# Patient Record
Sex: Female | Born: 1970 | Race: White | Hispanic: No | Smoking: Never smoker
Health system: Southern US, Community
[De-identification: ages and names within clinical notes are randomized; demographics above are authoritative.]

## PROBLEM LIST (undated history)

## (undated) DIAGNOSIS — K429 Umbilical hernia without obstruction or gangrene: Secondary | ICD-10-CM

## (undated) DIAGNOSIS — J45909 Unspecified asthma, uncomplicated: Secondary | ICD-10-CM

## (undated) DIAGNOSIS — Z9889 Other specified postprocedural states: Secondary | ICD-10-CM

## (undated) DIAGNOSIS — R112 Nausea with vomiting, unspecified: Secondary | ICD-10-CM

## (undated) DIAGNOSIS — L309 Dermatitis, unspecified: Secondary | ICD-10-CM

## (undated) HISTORY — PX: MICRODISCECTOMY LUMBAR: SUR864

## (undated) HISTORY — DX: Unspecified asthma, uncomplicated: J45.909

## (undated) HISTORY — PX: ABDOMINOPLASTY: SUR9

## (undated) HISTORY — PX: EYE SURGERY: SHX253

## (undated) HISTORY — DX: Dermatitis, unspecified: L30.9

## (undated) HISTORY — DX: Umbilical hernia without obstruction or gangrene: K42.9

## (undated) HISTORY — PX: OTHER SURGICAL HISTORY: SHX169

---

## 2006-01-25 ENCOUNTER — Encounter: Admission: RE | Admit: 2006-01-25 | Discharge: 2006-01-25 | Payer: Self-pay | Admitting: Radiology

## 2006-02-10 ENCOUNTER — Encounter
Admission: RE | Admit: 2006-02-10 | Discharge: 2006-02-10 | Payer: Self-pay | Admitting: Physical Medicine and Rehabilitation

## 2006-04-21 ENCOUNTER — Ambulatory Visit (HOSPITAL_COMMUNITY): Admission: RE | Admit: 2006-04-21 | Discharge: 2006-04-22 | Payer: Self-pay | Admitting: Neurosurgery

## 2006-05-02 ENCOUNTER — Ambulatory Visit (HOSPITAL_COMMUNITY): Admission: RE | Admit: 2006-05-02 | Discharge: 2006-05-02 | Payer: Self-pay | Admitting: Obstetrics and Gynecology

## 2006-08-29 ENCOUNTER — Ambulatory Visit (HOSPITAL_COMMUNITY): Admission: RE | Admit: 2006-08-29 | Discharge: 2006-08-29 | Payer: Self-pay | Admitting: Obstetrics and Gynecology

## 2006-09-23 ENCOUNTER — Inpatient Hospital Stay (HOSPITAL_COMMUNITY): Admission: AD | Admit: 2006-09-23 | Discharge: 2006-09-27 | Payer: Self-pay | Admitting: Obstetrics and Gynecology

## 2008-02-06 ENCOUNTER — Ambulatory Visit (HOSPITAL_COMMUNITY): Admission: RE | Admit: 2008-02-06 | Discharge: 2008-02-06 | Payer: Self-pay | Admitting: Obstetrics and Gynecology

## 2008-03-08 ENCOUNTER — Ambulatory Visit (HOSPITAL_COMMUNITY): Admission: RE | Admit: 2008-03-08 | Discharge: 2008-03-08 | Payer: Self-pay | Admitting: Obstetrics and Gynecology

## 2008-03-22 ENCOUNTER — Ambulatory Visit (HOSPITAL_COMMUNITY): Admission: RE | Admit: 2008-03-22 | Discharge: 2008-03-22 | Payer: Self-pay | Admitting: Obstetrics and Gynecology

## 2008-06-21 HISTORY — PX: TUBAL LIGATION: SHX77

## 2008-08-12 ENCOUNTER — Inpatient Hospital Stay (HOSPITAL_COMMUNITY): Admission: RE | Admit: 2008-08-12 | Discharge: 2008-08-15 | Payer: Self-pay | Admitting: Obstetrics and Gynecology

## 2008-08-12 ENCOUNTER — Encounter (INDEPENDENT_AMBULATORY_CARE_PROVIDER_SITE_OTHER): Payer: Self-pay | Admitting: Obstetrics and Gynecology

## 2009-01-27 ENCOUNTER — Encounter: Admission: RE | Admit: 2009-01-27 | Discharge: 2009-01-27 | Payer: Self-pay | Admitting: Neurosurgery

## 2009-06-02 ENCOUNTER — Encounter: Admission: RE | Admit: 2009-06-02 | Discharge: 2009-06-02 | Payer: Self-pay | Admitting: Neurosurgery

## 2009-06-16 ENCOUNTER — Ambulatory Visit (HOSPITAL_COMMUNITY): Admission: RE | Admit: 2009-06-16 | Discharge: 2009-06-17 | Payer: Self-pay | Admitting: Neurosurgery

## 2009-06-23 ENCOUNTER — Encounter: Admission: RE | Admit: 2009-06-23 | Discharge: 2009-06-23 | Payer: Self-pay | Admitting: Neurosurgery

## 2009-08-19 ENCOUNTER — Ambulatory Visit (HOSPITAL_COMMUNITY): Admission: RE | Admit: 2009-08-19 | Discharge: 2009-08-19 | Payer: Self-pay | Admitting: Neurosurgery

## 2009-12-16 ENCOUNTER — Ambulatory Visit (HOSPITAL_COMMUNITY): Admission: RE | Admit: 2009-12-16 | Discharge: 2009-12-16 | Payer: Self-pay | Admitting: Neurosurgery

## 2010-02-10 ENCOUNTER — Encounter: Admission: RE | Admit: 2010-02-10 | Discharge: 2010-03-31 | Payer: Self-pay | Admitting: Neurology

## 2010-09-07 ENCOUNTER — Ambulatory Visit: Payer: PRIVATE HEALTH INSURANCE | Attending: Neurology

## 2010-09-07 DIAGNOSIS — M47812 Spondylosis without myelopathy or radiculopathy, cervical region: Secondary | ICD-10-CM | POA: Insufficient documentation

## 2010-09-07 DIAGNOSIS — M2569 Stiffness of other specified joint, not elsewhere classified: Secondary | ICD-10-CM | POA: Insufficient documentation

## 2010-09-07 DIAGNOSIS — M79609 Pain in unspecified limb: Secondary | ICD-10-CM | POA: Insufficient documentation

## 2010-09-07 DIAGNOSIS — R209 Unspecified disturbances of skin sensation: Secondary | ICD-10-CM | POA: Insufficient documentation

## 2010-09-07 DIAGNOSIS — IMO0001 Reserved for inherently not codable concepts without codable children: Secondary | ICD-10-CM | POA: Insufficient documentation

## 2010-09-21 LAB — CBC
HCT: 39.1 % (ref 36.0–46.0)
Hemoglobin: 13.4 g/dL (ref 12.0–15.0)
MCHC: 34.4 g/dL (ref 30.0–36.0)
MCV: 88.2 fL (ref 78.0–100.0)
Platelets: 209 10*3/uL (ref 150–400)
RBC: 4.43 MIL/uL (ref 3.87–5.11)
RDW: 12.7 % (ref 11.5–15.5)
WBC: 6 10*3/uL (ref 4.0–10.5)

## 2010-10-06 LAB — CBC
HCT: 31.3 % — ABNORMAL LOW (ref 36.0–46.0)
HCT: 35.7 % — ABNORMAL LOW (ref 36.0–46.0)
MCHC: 33.9 g/dL (ref 30.0–36.0)
MCHC: 34.2 g/dL (ref 30.0–36.0)
MCV: 88.2 fL (ref 78.0–100.0)
MCV: 88.3 fL (ref 78.0–100.0)
Platelets: 157 10*3/uL (ref 150–400)
RDW: 13.9 % (ref 11.5–15.5)

## 2010-11-03 NOTE — Discharge Summary (Signed)
[name], [name]              ACCOUNT NO.:  [id number]   MEDICAL RECORD NO.:  [id number]          PATIENT TYPE:  INP   LOCATION:  9135                          [facility]ACILITY[facility]  [facility]   PHYSICIAN:  Zelphia Cairo, MD    DATE OF BIRTH:  [birth year]   DATE OF ADMISSION:  08/12/2008  DATE OF DISCHARGE:  08/15/2008                               DISCHARGE SUMMARY   ADMITTING DIAGNOSES:  1. Intrauterine pregnancy at 37 weeks estimated gestational age.  2. Previous cesarean section, desires repeat.  3. Multiparity, desires permanent sterilization.   DISCHARGE DIAGNOSES:  1. Status post low transverse cesarean section.  2. Viable female infant.  3. Bilateral tubal ligation.   REASON FOR ADMISSION:  Please see written H&P.   HOSPITAL COURSE:  The patient is 40 year old gravida 2, para 1 and was  noted to [facility] at 39 weeks estimated gestational  age for scheduled cesarean section.  The patient had had a previous  cesarean delivery and desired repeat.  Due to multiparity, the patient  also requested permanent sterilization.  On the morning of admission,  the patient was taken to the operating room where spinal anesthesia was  administered without difficulty.  A low transverse incision was made  with delivery of a viable female infant weighing 8 pounds 3 ounces,  Apgars of 9 at 1 minute and 9 at 5 minutes.  Bilateral tubal ligation  was performed without difficulty.  The patient tolerated the procedure  well and was taken to the recovery room in stable condition.  On  postoperative day #1, the patient was without complaint.  Vital signs  were stable.  She was afebrile.  Abdomen is soft with good return of  bowel function.  Laboratory findings revealed hemoglobin of 10.7,  platelet count 157,000, WBC count of 10.8.  Blood type was known to be  O+.  On postoperative day #2, the patient was without complaint.  Vital  signs remained stable.  She was afebrile.  Fundus firm and  nontender.  Incision was clean, dry, and intact.  On postoperative day #3, the  patient was without complaint.  Vital signs remained stable.  She was  afebrile.  Fundus firm and nontender.  Incision was noted to have some  ecchymosis noted superior and inferior to the incisional site.  Incision  was noted with subcuticular closure.  She was ambulating well.  Discharge instructions were reviewed and the patient was later  discharged home.   CONDITION ON DISCHARGE:  Stable.   DIET:  Regular, as tolerated.   ACTIVITY:  No heavy lifting, no driving x2 weeks, no vaginal entry.   FOLLOW UP:  The patient is to follow up in the office in 1-2 weeks for  an incision check.  She is to call for temperature greater than 100  degrees, persistent nausea, vomiting, heavy vaginal bleeding, and/or  redness or drainage from the incisional site.   DISCHARGE MEDICATIONS:  1. Tylox #30 one p.o. for every 6 hours.  2. Motrin 600 mg every 6 hours.  3. Prenatal vitamins 1 p.o. daily.  4. Colace 1 p.o.  daily.      Julio Sicks, N.P.      Zelphia Cairo, MD  Electronically Signed    CC/MEDQ  D:  08/15/2008  T:  08/15/2008  Job:  [id number]

## 2010-11-03 NOTE — Op Note (Signed)
NAMEBLONDINE, [name]              ACCOUNT NO.:  [id number]   MEDICAL RECORD NO.:  [id number]          PATIENT TYPE:  INP   LOCATION:  9135                          [facility]  [facility]   PHYSICIAN:  Michelle L. Grewal, M.D.DATE OF BIRTH:  02-24-1971   DATE OF PROCEDURE:  08/12/2008  DATE OF DISCHARGE:                               OPERATIVE REPORT   PREOPERATIVE DIAGNOSES:  1. Intrauterine pregnancy at 39 plus weeks.  2. Previous cesarean section.  3. Desires permanent sterilization.   POSTOPERATIVE DIAGNOSES:  1. Intrauterine pregnancy at 39 plus weeks.  2. Previous cesarean section.  3. Desires permanent sterilization.   PROCEDURE:  Repeat low transverse cesarean section and modified [place]  bilateral tubal ligation.   SURGEON:  Michelle L. Grewal, MD   ANESTHESIA:  Spinal.   FINDINGS:  This is a female infant, weighing 8 pounds 3 ounces, Apgars 9  at 1-minute and 9 at 5 minutes.   SPECIMENS:  Bilateral fallopian tube segments sent to Pathology.   ESTIMATED BLOOD LOSS:  Less than 500.   COMPLICATIONS:  None.   PROCEDURE:  The patient was taken to the operating room and her spinal  was placed.  She was prepped and draped in the usual sterile fashion.  Foley catheter was inserted and draining clear urine.  After she was  draped, a low transverse incision was made in the skin and carried down  to the fascia.  Fascia scored in the midline and extended laterally.  The rectus muscles were separated in the midline.  The peritoneum was  entered bluntly.  The peritoneal incision was then stretched.  The  bladder blade was inserted, the lower uterine segment was identified,  the bladder flap was created sharply and then digitally.  The bladder  blade was then readjusted.  A low transverse incision was made in the  uterus.  Amniotic fluid was noted to be clear.  The baby was in cephalic  presentation, was delivered easily with a vacuum extractor.  The baby  was a female infant,  Apgars 9 at 1-minute and 9 at 5 minutes.  The cord  was clamped and cut.  The baby was handed to the awaiting pediatricians  and was taken to the newborn nursery.  The placenta was manually  removed, noted to be normal intact with a three-vessel cord.  Of note,  antibiotics and Pitocin were given.  The uterus was exteriorized and it  was cleared of all clots and debris.  The uterine incision was closed in  2 layers using 0 chromic in a running locked stitch.  Hemostasis was  excellent.  At this point, we performed a tubal ligation, identified  each fimbriated end, and grasped each midportion of the tube with a  Babcock clamp.  A 3-cm knuckle was tied off using plain gut suture x2  and then excessive knuckle was cut with Metzenbaum scissors.  After the  tubal ligation was performed, the uterus was returned to the abdomen.  Irrigation was performed.  All surgical pedicles were inspected and  noted to be hemostatic.  The peritoneum and rectus muscles were  reapproximated using 0 Vicryl in a running stitch after reapproximation  of the rectus muscles.  The fascia was closed using 0 Vicryl in a  running stitch continuous.  After irrigation of the subcutaneous layer,  the subcutaneous was closed with subcuticular using 3-0 Vicryl and the  skin was closed with Dermabond skin adhesive.  All sponge, lap, and  instrument counts were correct x2.  The patient went to recovery room in  stable condition.      Michelle L. Vincente Poli, M.D.  Electronically Signed     MLG/MEDQ  D:  08/12/2008  T:  08/12/2008  Job:  [id number]

## 2010-11-06 ENCOUNTER — Other Ambulatory Visit (HOSPITAL_COMMUNITY): Payer: Self-pay | Admitting: Neurosurgery

## 2010-11-06 ENCOUNTER — Ambulatory Visit (HOSPITAL_COMMUNITY)
Admission: RE | Admit: 2010-11-06 | Discharge: 2010-11-06 | Disposition: A | Discharge status: Home / Self Care | Payer: PRIVATE HEALTH INSURANCE | Source: Ambulatory Visit | Attending: Neurosurgery | Admitting: Neurosurgery

## 2010-11-06 DIAGNOSIS — M545 Low back pain, unspecified: Secondary | ICD-10-CM | POA: Insufficient documentation

## 2010-11-06 DIAGNOSIS — M5137 Other intervertebral disc degeneration, lumbosacral region: Secondary | ICD-10-CM

## 2010-11-06 DIAGNOSIS — IMO0002 Reserved for concepts with insufficient information to code with codable children: Secondary | ICD-10-CM

## 2010-11-06 DIAGNOSIS — M51379 Other intervertebral disc degeneration, lumbosacral region without mention of lumbar back pain or lower extremity pain: Secondary | ICD-10-CM | POA: Insufficient documentation

## 2010-11-06 DIAGNOSIS — M519 Unspecified thoracic, thoracolumbar and lumbosacral intervertebral disc disorder: Secondary | ICD-10-CM | POA: Insufficient documentation

## 2010-11-06 DIAGNOSIS — M5126 Other intervertebral disc displacement, lumbar region: Secondary | ICD-10-CM | POA: Insufficient documentation

## 2010-11-06 NOTE — Op Note (Signed)
[name], [name]              ACCOUNT NO.:  [id number]   MEDICAL RECORD NO.:  [id number]          PATIENT TYPE:  AMB   LOCATION:  SDS                          FACILITY:  [facility]   PHYSICIAN:  Hewitt Shorts, M.D.DATE OF BIRTH:  05-27-1971   DATE OF PROCEDURE:  04/21/2006  DATE OF DISCHARGE:                                 OPERATIVE REPORT   PREOPERATIVE DIAGNOSES:  Right L5-S1 lumbar disk herniation, lumbar  degenerative disk disease, lumbar spondylosis and lumbar radiculopathy.   POSTOPERATIVE DIAGNOSES:  Right L5-S1 lumbar disk herniation, lumbar  degenerative disk disease, lumbar spondylosis and lumbar radiculopathy.   PROCEDURE:  Right L5-S1 lumbar laminotomy and microdiskectomy.   SURGEON:  Hewitt Shorts, M.D.   ASSISTANT:  Hilda Lias, M.D.   ANESTHESIA:  General endotracheal.   INDICATIONS:  The patient is a 40 year old woman, who presented with a right  L5-S1 lumbar disk herniation, resulting in lumbar radiculopathy.  She is [redacted]  weeks pregnant.  Despite extensive conservative treatment over the past 2  months, the pain has become unbearable and she is brought to surgery for  microdiskectomy.   DESCRIPTION OF PROCEDURE:  The patient was brought to the operating room,  placed under general endotracheal anesthesia.  The patient was turned to the  prone position, with care taken to insure that her iliac crest and shoulders  rested against the rolls and that her abdomen hung freely between the rolls.  The lumbar region was then prepared with Betadine Soap and Solution and  draped in a sterile fashion.  The midline was infiltrated with local  anesthetic with epinephrine.  The L5 and S1 levels were identified by bony  landmarks, and then a midline incision was made and carried down through the  subcutaneous tissue.  Bipolar cautery and electrocautery were used to  maintain hemostasis.  Dissection was carried down to the lumbar fascia,  which was incised on the  right side of the midline, and the paraspinous  muscles were dissected from the spinous process and lamina in a  subperiosteal fashion.  The sacrum was identified, and from there, we  identified the L5-S1 interlaminar space.  A self-retaining retractor was  placed and laminotomies were performed using the X-Max drill and Kerrison  punches.  The microscope was draped and brought into the field to provide  additional magnification, illumination and visualization, and the  decompression was performed using microdissection and microsurgical  technique.  The laminotomy was extended both rostrally and caudally, and  then the ligamentum flavum was carefully removed.  We identified the thecal  sac and exiting right S1 nerve root, and these structures were gently  retracted medially.  The epidural veins were identified and coagulated and  divided, and then we gently retracted the thecal sac and nerve root  medially, exposing the disk herniation.  There was a disk herniation at the  level of the annulus, and then a significant fragment that extended caudally  behind the body of S1 and directly compressing the right S1 nerve root.  We  incised the annulus and mobilized the free fragment, and the nerve  root was  decompressed.  We then entered into the disk space and proceeded with a  thorough diskectomy, removing all loose fragments of disk material.  There  was moderate spondylytic overgrowth on the posterior aspects at L5 and S1,  and this was carefully removed as well.  In the end, good decompression of  the thecal sac and nerve root was achieved.  The epidural space and disk  space were carefully examined and all loose fragments of disk material had  been removed, and then we established hemostasis with the use of bipolar  cautery, and once hemostasis was established and the diskectomy completed,  we instilled 2 cc of fentanyl and 80 mg of Depo-Medrol into the epidural  space, and then proceeded  with closure.  The deep fascia was closed with  interrupted, undyed 1 Vicryl suture.  Scarpa's fascia was closed with  interrupted, undyed 1 Vicryl suture.  The subcutaneous and subcuticular  layers were closed interrupted, inverted 2-0 and 3-0 undyed Vicryl sutures,  and the skin edges were approximated with Dermabond and the wound was  dressed with Adaptic and sterile gauze.  The procedure was tolerated well.  Following the procedure, she was turned back to a supine position to be  reversed from the anesthetic, extubated and was transferred to the recovery  room for further care.      Hewitt Shorts, M.D.  Electronically Signed     RWN/MEDQ  D:  04/21/2006  T:  04/22/2006  Job:  [id number]   cc:   Hilda Lias, M.D.

## 2010-11-06 NOTE — Op Note (Signed)
[name], [name]              ACCOUNT NO.:  [id number]   MEDICAL RECORD NO.:  [id number]          PATIENT TYPE:  INP   LOCATION:  9130                          [facility]ILITY[facility]   PHYSICIAN:  Duke Salvia. Marcelle Overlie, M.D.DATE OF BIRTH:  05-23-1971   DATE OF PROCEDURE:  09/24/2006  DATE OF DISCHARGE:                               OPERATIVE REPORT   PREOPERATIVE DIAGNOSIS:  Term intrauterine pregnancy, scheduled cesarean  due to history of lower back surgery, range of motion with labor.   POSTOPERATIVE DIAGNOSIS:  Term intrauterine pregnancy, scheduled  cesarean due to history of lower back surgery, range of motion with  labor.   PROCEDURE:  Primary low transverse cesarean section.   SURGEON:  [place]   ANESTHESIA:  Spinal.   COMPLICATIONS:  None.   DRAINS:  Foley catheter.   BLOOD LOSS:  600.   PROCEDURE AND FINDINGS:  The patient was taken to the operating room.  After an adequate level of spinal anesthetic was obtained with the  patient in left tilt position, the abdomen prepped in the usual manner  for sterile abdominal procedures, Foley catheter positioned draining  clear urine.  Transverse Pfannenstiel incision made 2 fingerbreadths  above the symphysis, carried down to the fascia which was incised and  extended transversely.  Rectus muscle was divided in the midline.  Peritoneum entered superiorly without incident and extended in a  vertical fashion.  The vesicouterine serosa was then incised, and the  bladder was bluntly and sharply dissected off of the lower uterine  segment, and bladder blade was positioned.  Transverse incision made in  the lower segment.  Clear fluid was noted.  This was dissected with  blunt dissection transversely, straight OP presentation.  The infant was  delivered easily, Apgars 9 and 9, female.  The infant was suctioned, cord  clamped, and passed to pediatric team for further care.  Placenta  delivered spontaneously intact.  Uterus exteriorized,  cavity wiped clean  with a laparotomy pack, closure obtained with the first layer of 0  chromic in a locked fashion followed by an imbricating layer of 0  chromic.  This was hemostatic.  Tubes and ovaries were normal.  Prior to  closure, sponge, needle, and instrument counts were reported as correct  x2.  Peritoneum closed with a running 2-0 Vicryl, the same on the rectus  muscles.  A 0 PDS was then used from laterally to midline on either side  to close the fascia.  Subcutaneous tissue was  hemostatic.  Clips and Steri-Strips used on the skin.  Sterile dressing  applied.  Clear urine noted at the end of the case.  She did receive  Ancef 1 g IV after the cord was clamped along with Pitocin IV and went  to recovery room in good condition.      Richard M. Marcelle Overlie, M.D.  Electronically Signed     RMH/MEDQ  D:  09/24/2006  T:  09/24/2006  Job:  [id number]

## 2010-11-06 NOTE — Discharge Summary (Signed)
NAMESUZI, HERNAN              ACCOUNT NO.:  [id number]   MEDICAL RECORD NO.:  [id number]          PATIENT TYPE:  INP   LOCATION:  9119                          [facility]   PHYSICIAN:  Guy Sandifer. Henderson Cloud, M.D. DATE OF BIRTH:  1971-01-18   DATE OF ADMISSION:  09/23/2006  DATE OF DISCHARGE:  09/27/2006                               DISCHARGE SUMMARY   ADMISSION DIAGNOSES:  1. Intrauterine pregnancy at term.  2. Spontaneous rupture of membranes with onset of labor.  3. History of lumbar disk surgery.   DISCHARGE DIAGNOSIS:  1. Status post low transverse cesarean section.  2. Viable female infant.   PROCEDURE:  Primary low transverse cesarean section.   REASON FOR ADMISSION:  Please see written H&P.   HOSPITAL COURSE:  The patient is a 40 year old primigravida that was  admitted to [facility] with spontaneous rupture of  membranes and onset of labor.  The patient had previously been scheduled  for a primary cesarean delivery in approximately 2 days secondary to  previous back surgery. Due to spontaneous onset of labor and rupture of  membranes decision was made to proceed with a primary low transverse  cesarean section.  The patient was then taken to the operating room  where spinal anesthesia was administered without difficulty.  A low  transverse incision was made with delivery of a viable female infant  weighing 7 pounds 12 ounces Apgars of 9 at 1 minute and 9 at 5 minutes.  The patient tolerated procedure well and taken to the recovery room in  stable condition.  On postoperative day #1 the patient was without  complaint.  Vital signs were stable.  Abdominal dressing was noted to be  clean, dry and intact.  On postoperative day #2 the patient was without  complaint.  She was afebrile.  Abdomen soft.  Fundus firm and nontender.  Abdominal dressing had been removed revealing an incision that is clean,  dry and intact.  Laboratory findings showed hemoglobin of  11.7. On  postoperative day #3 the patient was without complaint.  Vital signs  were stable.  She is afebrile.  Fundus firm and nontender.  Incision was  clean, dry and intact.  Staples were removed.  Discharge instructions  reviewed.  However, the patient was not discharged that day. On  postoperative day #4 the patient was without complaint.  Vital signs  were stable.  She is afebrile.  Abdomen soft.  Fundus firm and  nontender.  Incision was clean, dry and intact.  The patient was then  discharged home.   CONDITION ON DISCHARGE:  Stable.   DIET:  Regular as tolerated.   ACTIVITY:  No heavy lifting, no driving x2 weeks, no vaginal entry.   FOLLOW UP:  Patient is to follow up in the office in 1-2 weeks for  incision check.  She is to call for temperature greater than 100  degrees, persistent nausea, vomiting, heavy vaginal bleeding and/or  redness or drainage from incisional site.   DISCHARGE MEDICATIONS:  1. Percocet 5/325 #30 one p.o. every 4-6 hours p.r.n.  2. Motrin 600  mg every 6 hours.  3. Prenatal vitamins one p.o. daily.  4. Colace one p.o. daily p.r.n.      Julio Sicks, N.P.      Guy Sandifer. Henderson Cloud, M.D.  Electronically Signed    CC/MEDQ  D:  10/28/2006  T:  10/28/2006  Job:  [id number]

## 2011-01-13 ENCOUNTER — Other Ambulatory Visit: Payer: Self-pay | Admitting: Neurology

## 2011-01-13 DIAGNOSIS — M47812 Spondylosis without myelopathy or radiculopathy, cervical region: Secondary | ICD-10-CM

## 2011-01-13 DIAGNOSIS — R209 Unspecified disturbances of skin sensation: Secondary | ICD-10-CM

## 2011-01-21 ENCOUNTER — Ambulatory Visit
Admission: RE | Admit: 2011-01-21 | Discharge: 2011-01-21 | Disposition: A | Discharge status: Home / Self Care | Payer: PRIVATE HEALTH INSURANCE | Source: Ambulatory Visit | Attending: Neurology | Admitting: Neurology

## 2011-01-21 DIAGNOSIS — R209 Unspecified disturbances of skin sensation: Secondary | ICD-10-CM

## 2011-01-21 DIAGNOSIS — M47812 Spondylosis without myelopathy or radiculopathy, cervical region: Secondary | ICD-10-CM

## 2011-01-21 MED ORDER — GADOBENATE DIMEGLUMINE 529 MG/ML IV SOLN
12.0000 mL | Freq: Once | INTRAVENOUS | Status: AC | PRN
  Administered 2011-01-21: 12 mL via INTRAVENOUS

## 2011-12-30 ENCOUNTER — Other Ambulatory Visit: Payer: Self-pay | Admitting: Obstetrics and Gynecology

## 2012-01-03 ENCOUNTER — Telehealth (INDEPENDENT_AMBULATORY_CARE_PROVIDER_SITE_OTHER): Payer: Self-pay | Admitting: General Surgery

## 2012-01-03 NOTE — Telephone Encounter (Signed)
DR. Chilton Si FROM [facility] CALLED RE AN APPOINTMENT WITH DR. GERKIN FOR UMBILICAL HERNIA/GY

## 2012-01-05 ENCOUNTER — Ambulatory Visit (INDEPENDENT_AMBULATORY_CARE_PROVIDER_SITE_OTHER): Payer: PRIVATE HEALTH INSURANCE | Admitting: Surgery

## 2012-01-05 ENCOUNTER — Encounter (INDEPENDENT_AMBULATORY_CARE_PROVIDER_SITE_OTHER): Payer: Self-pay | Admitting: Surgery

## 2012-01-05 VITALS — BP 120/66 | HR 72 | Temp 97.4°F | Resp 14 | Ht 62.0 in | Wt 129.2 lb

## 2012-01-05 DIAGNOSIS — K429 Umbilical hernia without obstruction or gangrene: Secondary | ICD-10-CM

## 2012-01-05 DIAGNOSIS — M6208 Separation of muscle (nontraumatic), other site: Secondary | ICD-10-CM

## 2012-01-05 DIAGNOSIS — M6200 Separation of muscle (nontraumatic), unspecified site: Secondary | ICD-10-CM

## 2012-01-05 NOTE — Progress Notes (Signed)
General Surgery [facility]  Chief Complaint  Patient presents with  . Umbilical Hernia    new pt - umb hernia - referral from Dr. 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 Patient has had 2 children, ages 24 years old and 73 years old. She has developed a significant rectus diastasis.  She has seen a Engineer, petroleum in consultation and is anticipating an abdominoplasty. She was found to have an umbilical hernia which is reducible. She would like to undergo concurrent repair.  Patient has had 2 prior cesarean sections. She also had tubal ligation. She has had no other abdominal surgery.  Past Medical History  Diagnosis Date  . Umbilical hernia      Current Outpatient Prescriptions  Medication Sig Dispense Refill  . loratadine-pseudoephedrine (CLARITIN-D 12-HOUR) 5-120 MG per tablet Take 1 tablet by mouth 2 (two) times daily.         No Known Allergies   Family History  Problem Relation Age of Onset  . Cancer Mother     melanoma     History   Social History  . Marital Status: Married    Spouse Name: N/A    Number of Children: N/A  . Years of Education: N/A   Social History Main Topics  . Smoking status: Never Smoker   . Smokeless tobacco: None  . Alcohol Use: No  . Drug Use: No  . Sexually Active:    Other Topics Concern  . None   Social History Narrative  . None     REVIEW OF SYSTEMS - PERTINENT POSITIVES ONLY: No pain. No prior hernia repair. No signs or symptoms of intestinal obstruction.  EXAM: Filed Vitals:   01/05/12 1130  BP: 120/66  Pulse: 72  Temp: 97.4 F (36.3 C)  Resp: 14    HEENT: normocephalic; pupils equal and reactive; sclerae clear; dentition good; mucous membranes moist NECK:  symmetric on extension; no palpable anterior or posterior cervical lymphadenopathy; no supraclavicular masses; no tenderness; well-healed surgical incision  left anterior neck CHEST: clear to auscultation bilaterally without rales, rhonchi, or wheezes CARDIAC: regular rate and rhythm without significant murmur; peripheral pulses are full ABDOMEN: soft without distension; bowel sounds present; no mass; no hepatosplenomegaly; significant rectus diastases with redundant skin, small reducible umbilical hernia with fascial defect measuring approximately 1 cm in diameter. EXT:  non-tender without edema; no deformity NEURO: no gross focal deficits; no sign of tremor   LABORATORY RESULTS: See [facility] (CHL-Epic) for most recent results   RADIOLOGY RESULTS: See [facility] (CHL-Epic) for most recent results   IMPRESSION: #1 reducible umbilical hernia, asymptomatic #2 significant rectus diastasis secondary to pregnancy  PLAN: Patient is anticipating an abdominoplasty by her plastic surgeon in September 2013. She would like to undergo concurrent repair of her umbilical hernia. The patient and I discussed the risk and benefits of the combined procedure. We discussed the possibility of ischemia in the umbilical stalk. We discussed the possibility of recurrent hernia and options for repair. Patient understands and wishes to proceed. We will coordinate with plastic surgery.  The risks and benefits of the procedure have been discussed at length with the patient.  The patient understands the proposed procedure, potential alternative treatments, and the course of recovery to be expected.  All of the patient's questions have been answered at this time.  The patient wishes to proceed with surgery.  Velora Heckler, MD,  FACS General & Endocrine Surgery [facility]   Visit Diagnoses: 1. Umbilical hernia, reducible   2. Rectus diastasis     Primary Care Physician: Jeani Hawking, MD

## 2012-01-05 NOTE — Patient Instructions (Signed)
[facility]  [facility] REPAIR POST OP INSTRUCTIONS  Always review your discharge instruction sheet given to you by the facility where your surgery was performed.  1. A  prescription for pain medication may be given to you upon discharge.  Take your pain medication as prescribed.  If narcotic pain medicine is not needed, then you may take acetaminophen (Tylenol) or ibuprofen (Advil) as needed. 2. Take your usually prescribed medications unless otherwise directed. 3. If you need a refill on your pain medication, please contact your pharmacy.  They will contact our office to request authorization. Prescriptions will not be filled after 5 pm daily or on weekends. 4. You should follow a light diet the first 24 hours after arrival home, such as soup and crackers or toast.  Be sure to include plenty of fluids daily.  Resume your normal diet the day after surgery. 5. Most patients will experience some swelling and bruising around the surgical site.  Ice packs and reclining will help.  Swelling and bruising can take several days to resolve.  6. It is common to experience some constipation if taking pain medication after surgery.  Increasing fluid intake and taking a stool softener (such as Colace) will usually help or prevent this problem from occurring.  A mild laxative (Milk of Magnesia or Miralax) should be taken according to package directions if there are no bowel movements after 48 hours. 7. Unless discharge instructions indicate otherwise, you may remove your bandages 24-48 hours after surgery, and you may shower at that time.  You may have steri-strips (small skin tapes) in place directly over the incision.  These strips should be left on the skin for 7-10 days.  If your surgeon used skin glue on the incision, you may shower in 24 hours.  The glue will flake off over the next 2-3 weeks.  Any sutures or staples will be removed at the office during your follow-up visit. 8. ACTIVITIES:  You  may resume regular (light) daily activities beginning the next day-such as daily self-care, walking, climbing stairs-gradually increasing activities as tolerated.  You may have sexual intercourse when it is comfortable.  Refrain from any heavy lifting or straining until approved by your doctor.  You may drive when you are no longer taking prescription pain medication, you can comfortably wear a seatbelt, and you can safely maneuver your car and apply brakes. 9. You should see your doctor in the office for a follow-up appointment approximately 2-3 weeks after your surgery.  Make sure that you call for this appointment within a day or two after you arrive home to insure a convenient appointment time.  WHEN TO CALL YOUR DOCTOR: 1. Fever greater than 101.0 2. Inability to urinate 3. Persistent nausea and/or vomiting 4. Extreme swelling or bruising 5. Continued bleeding from incision 6. Increased pain, redness, or drainage from the incision  The clinic staff is available to answer your questions during regular business hours.  Please don't hesitate to call and ask to speak to one of the nurses for clinical concerns.  If you have a medical emergency, go to the nearest emergency room or call 911.  A surgeon from [facility] is always on call for the hospital.   [facility] [address], [address], [place],   [address]  [phone] ? [phone] ? FAX [phone] [email or web address]   

## 2012-03-06 DIAGNOSIS — K429 Umbilical hernia without obstruction or gangrene: Secondary | ICD-10-CM

## 2012-03-10 ENCOUNTER — Telehealth (INDEPENDENT_AMBULATORY_CARE_PROVIDER_SITE_OTHER): Payer: Self-pay

## 2012-03-10 NOTE — Telephone Encounter (Signed)
Pt home doing well. Po appt made. 

## 2012-03-22 ENCOUNTER — Ambulatory Visit (INDEPENDENT_AMBULATORY_CARE_PROVIDER_SITE_OTHER): Payer: PRIVATE HEALTH INSURANCE | Admitting: Surgery

## 2012-03-22 ENCOUNTER — Encounter (INDEPENDENT_AMBULATORY_CARE_PROVIDER_SITE_OTHER): Payer: Self-pay | Admitting: Surgery

## 2012-03-22 VITALS — BP 98/48 | HR 72 | Temp 97.1°F | Resp 16 | Ht 62.0 in | Wt 128.6 lb

## 2012-03-22 DIAGNOSIS — K429 Umbilical hernia without obstruction or gangrene: Secondary | ICD-10-CM

## 2012-03-22 NOTE — Progress Notes (Signed)
General Surgery [facility]  Visit Diagnoses: 1. Umbilical hernia, reducible     HISTORY: Patient returns for first postoperative visit having undergone umbilical hernia repair by primary technique concurrent with abdominoplasty. Patient has done well following her procedure except for the development of a contact dermatitis along the waistline.  EXAM: Surgical incisions are healing nicely. Contact dermatitis does exist along the waistline. It seems to be resolving. The umbilicus appears to be well vascularized. There is no sign of ischemia. With Valsalva there is no sign of recurrence.  IMPRESSION: Status post umbilical hernia repair concurrent with abdominoplasty, no evidence of recurrence  PLAN: Patient will return to activity as directed by her plastic surgeon. I've recommended that she apply topical moisturizing creams to her incisions. She will return for follow-up as needed.  Velora Heckler, MD, FACS General & Endocrine Surgery [facility]

## 2012-03-22 NOTE — Patient Instructions (Signed)
  COCOA BUTTER & VITAMIN E CREAM  (Palmer's or other brand)  Apply cocoa butter/vitamin E cream to your incision 2 - 3 times daily.  Massage cream into incision for one minute with each application.  Use sunscreen (50 SPF or higher) for first 6 months after surgery if area is exposed to sun.  You may substitute Mederma or other scar reducing creams as desired.   

## 2012-07-27 ENCOUNTER — Other Ambulatory Visit: Payer: Self-pay | Admitting: Obstetrics and Gynecology

## 2012-07-27 DIAGNOSIS — Z1231 Encounter for screening mammogram for malignant neoplasm of breast: Secondary | ICD-10-CM

## 2012-07-31 ENCOUNTER — Ambulatory Visit
Admission: RE | Admit: 2012-07-31 | Discharge: 2012-07-31 | Disposition: A | Discharge status: Home / Self Care | Payer: PRIVATE HEALTH INSURANCE | Source: Ambulatory Visit | Attending: Obstetrics and Gynecology | Admitting: Obstetrics and Gynecology

## 2012-07-31 DIAGNOSIS — Z1231 Encounter for screening mammogram for malignant neoplasm of breast: Secondary | ICD-10-CM

## 2013-01-16 ENCOUNTER — Other Ambulatory Visit: Payer: Self-pay | Admitting: Obstetrics and Gynecology

## 2013-07-20 ENCOUNTER — Other Ambulatory Visit: Payer: Self-pay

## 2013-07-20 DIAGNOSIS — Z1231 Encounter for screening mammogram for malignant neoplasm of breast: Secondary | ICD-10-CM

## 2013-08-14 ENCOUNTER — Ambulatory Visit
Admission: RE | Admit: 2013-08-14 | Discharge: 2013-08-14 | Disposition: A | Discharge status: Home / Self Care | Payer: PRIVATE HEALTH INSURANCE | Source: Ambulatory Visit

## 2013-08-14 DIAGNOSIS — Z1231 Encounter for screening mammogram for malignant neoplasm of breast: Secondary | ICD-10-CM

## 2014-01-18 ENCOUNTER — Other Ambulatory Visit: Payer: Self-pay | Admitting: Obstetrics and Gynecology

## 2014-01-21 LAB — CYTOLOGY - PAP

## 2014-07-17 ENCOUNTER — Other Ambulatory Visit: Payer: Self-pay | Admitting: Obstetrics and Gynecology

## 2014-07-17 DIAGNOSIS — Z0000 Encounter for general adult medical examination without abnormal findings: Secondary | ICD-10-CM

## 2014-07-24 ENCOUNTER — Ambulatory Visit
Admission: RE | Admit: 2014-07-24 | Discharge: 2014-07-24 | Disposition: A | Discharge status: Home / Self Care | Payer: PRIVATE HEALTH INSURANCE | Source: Ambulatory Visit | Attending: Obstetrics and Gynecology | Admitting: Obstetrics and Gynecology

## 2014-07-24 DIAGNOSIS — Z0000 Encounter for general adult medical examination without abnormal findings: Secondary | ICD-10-CM

## 2014-12-17 ENCOUNTER — Other Ambulatory Visit: Payer: Self-pay | Admitting: Neurosurgery

## 2014-12-17 DIAGNOSIS — M545 Low back pain: Secondary | ICD-10-CM

## 2014-12-18 ENCOUNTER — Ambulatory Visit
Admission: RE | Admit: 2014-12-18 | Discharge: 2014-12-18 | Disposition: A | Discharge status: Home / Self Care | Payer: PRIVATE HEALTH INSURANCE | Source: Ambulatory Visit | Attending: Neurosurgery | Admitting: Neurosurgery

## 2014-12-18 ENCOUNTER — Other Ambulatory Visit: Payer: PRIVATE HEALTH INSURANCE

## 2014-12-18 DIAGNOSIS — M545 Low back pain: Secondary | ICD-10-CM

## 2014-12-18 MED ORDER — GADOBENATE DIMEGLUMINE 529 MG/ML IV SOLN
12.0000 mL | Freq: Once | INTRAVENOUS | Status: AC | PRN
  Administered 2014-12-18: 12 mL via INTRAVENOUS

## 2014-12-25 ENCOUNTER — Other Ambulatory Visit: Payer: Self-pay | Admitting: Neurosurgery

## 2014-12-25 ENCOUNTER — Ambulatory Visit
Admission: RE | Admit: 2014-12-25 | Discharge: 2014-12-25 | Disposition: A | Discharge status: Home / Self Care | Payer: PRIVATE HEALTH INSURANCE | Source: Ambulatory Visit | Attending: Neurosurgery | Admitting: Neurosurgery

## 2014-12-25 DIAGNOSIS — M549 Dorsalgia, unspecified: Secondary | ICD-10-CM

## 2016-08-27 IMAGING — MR MR LUMBAR SPINE WO/W CM
5 of 7 series · 30 of 48 positions shown · IV contrast (multihance)
Comparison: MRI lumbar spine 01/25/2006. Lumbar radiographs
11/06/2010.

CLINICAL DATA: Low back and RIGHT leg pain for 1-2 weeks.
Subjective RIGHT leg weakness. Prior microdiscectomy 3119.

EXAM:
MRI LUMBAR SPINE WITHOUT AND WITH CONTRAST
TECHNIQUE: Multiplanar and multiecho pulse sequences of the lumbar spine were
obtained without and with intravenous contrast.
CONTRAST:  12mL MULTIHANCE GADOBENATE DIMEGLUMINE 529 MG/ML IV SOLN

[Series 2: T1 · sagittal · 4.0mm · 0.55mm/px · 4 of 14 slices shown (1 of 2)]
[im 1/14]
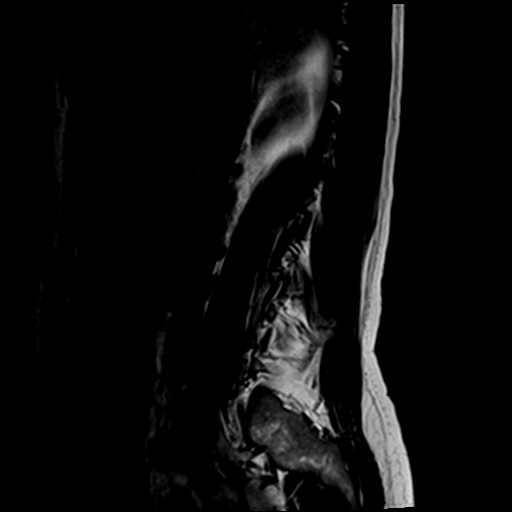
[im 5/14]
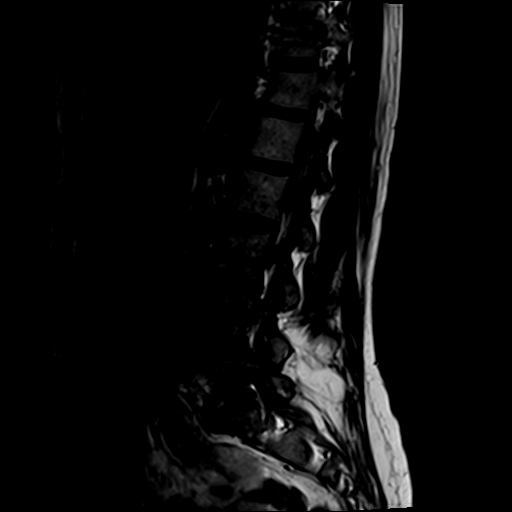
[im 9/14]
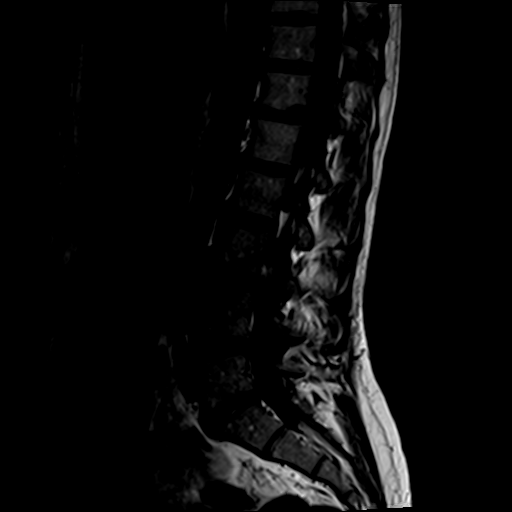
[im 14/14]
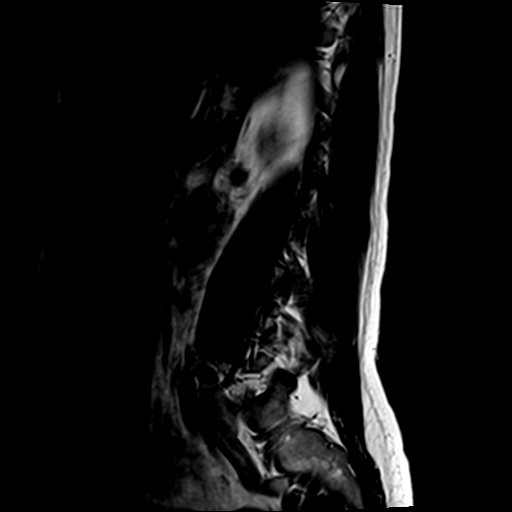

[Series 3: STIR · sagittal · 4.0mm · 0.55mm/px · 3 of 14 slices shown]
[im 1/14]
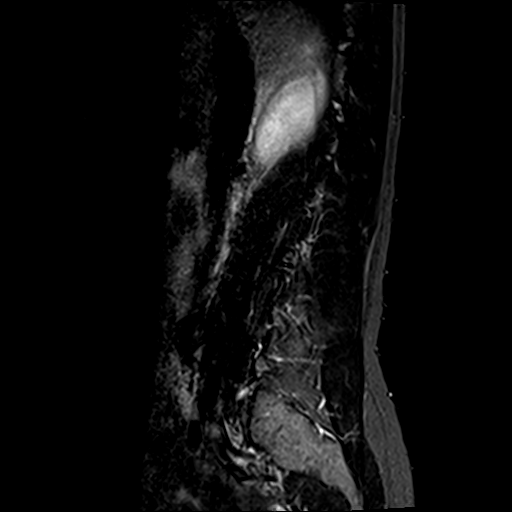
[im 5/14]
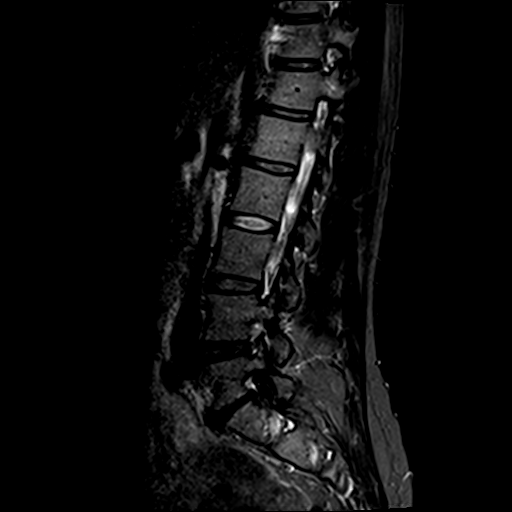
[im 9/14]
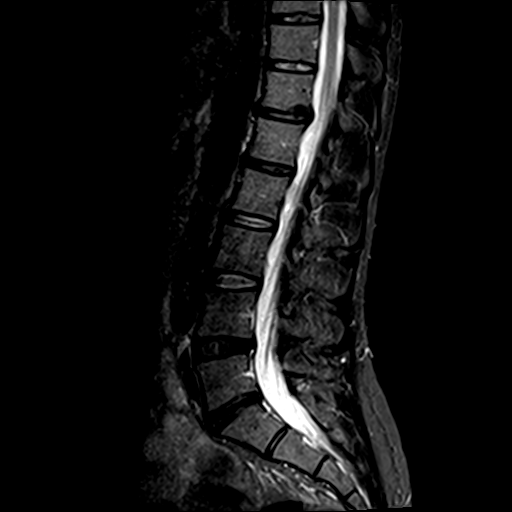

[Series 4: T2 · axial · 4.0mm · 0.74mm/px · z∈[-71,+99]mm · 8 of 36 slices shown (1 of 2)]
[im 1/36]
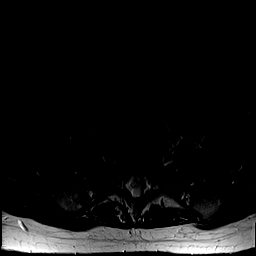
[im 4/36]
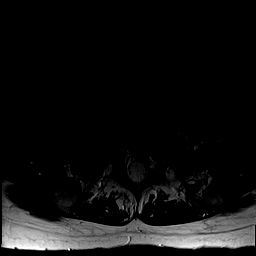
[im 12/36]
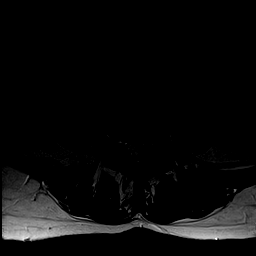
[im 16/36]
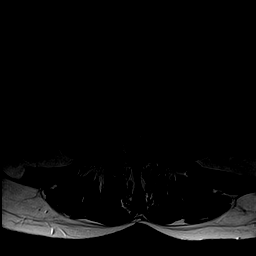
[im 20/36]
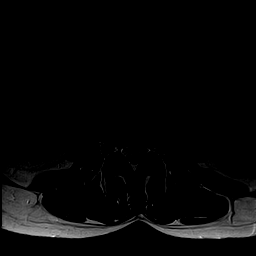
[im 24/36]
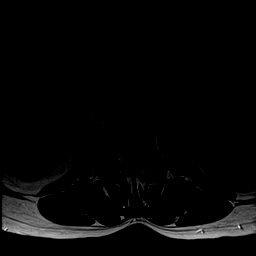
[im 32/36]
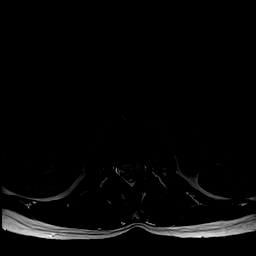
[im 36/36]
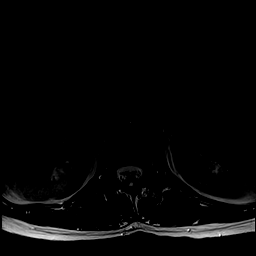

[Series 5: T1 · axial · 4.0mm · 0.74mm/px · z∈[-71,+125]mm · 11 of 39 slices shown (2 of 2)]
[im 1/39]
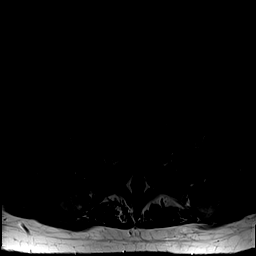
[im 4/39]
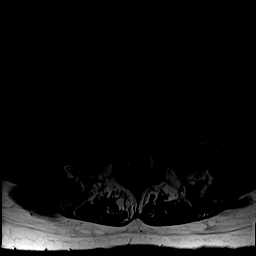
[im 8/39]
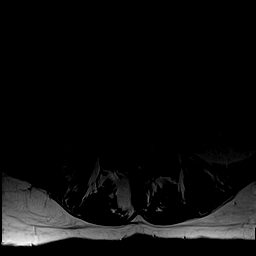
[im 12/39]
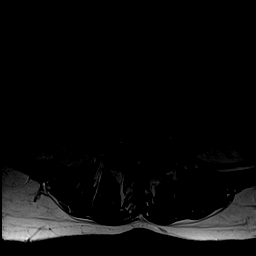
[im 16/39]
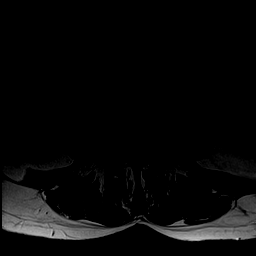
[im 20/39]
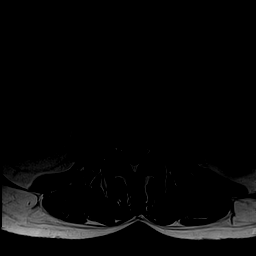
[im 23/39]
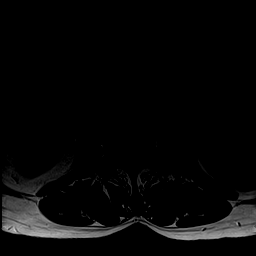
[im 27/39]
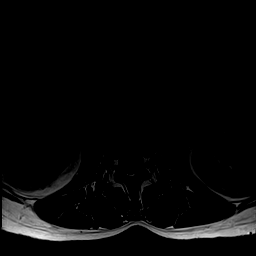
[im 31/39]
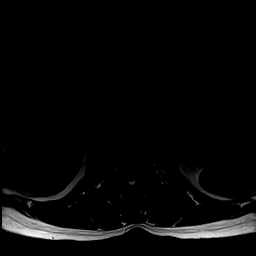
[im 35/39]
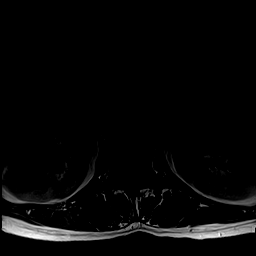
[im 39/39]
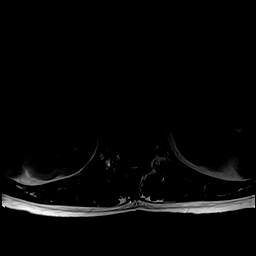

[Series 6: T2 · sagittal · 4.0mm · 0.44mm/px · 4 of 14 slices shown (2 of 2)]
[im 1/14]
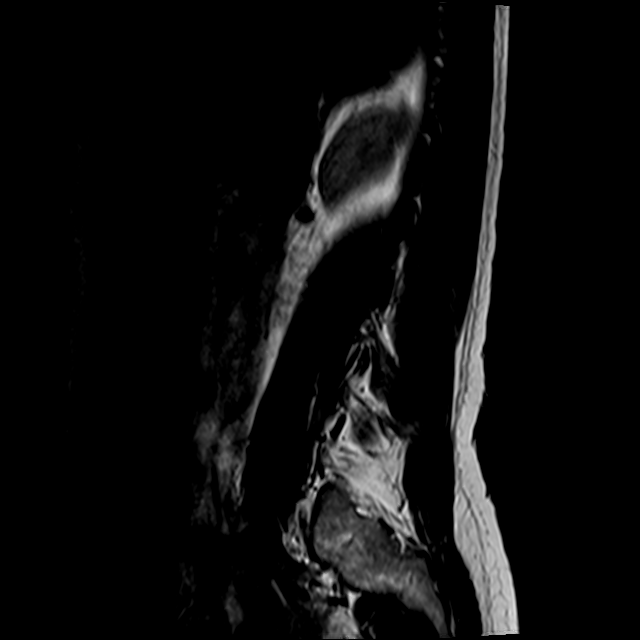
[im 5/14]
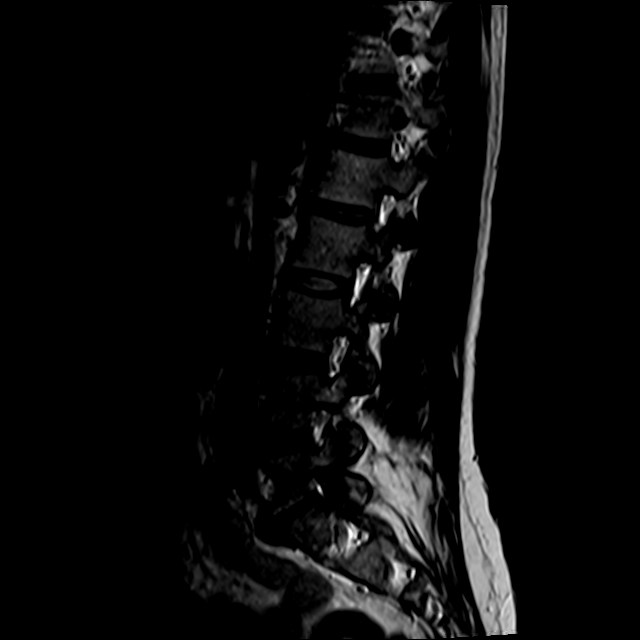
[im 9/14]
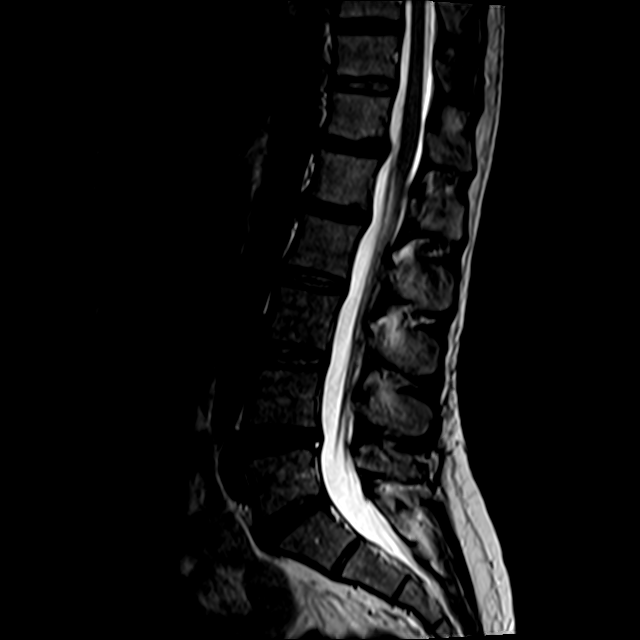
[im 14/14]
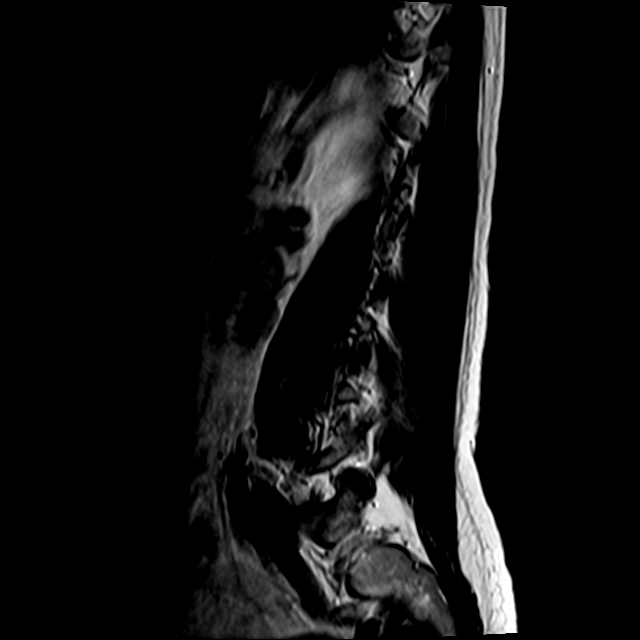

[30 of 48 positions shown; findings below may reference images not displayed]

FINDINGS: Segmentation: Normal.

Alignment:  Normal.

Vertebrae: No worrisome osseous lesion.No abnormal postcontrast
enhancement of the vertebral bodies.

Conus medullaris: Normal in size, signal, and location.

Paraspinal tissues: No evidence for hydronephrosis or paravertebral
mass.

Disc levels:

T12-L1: Marginal Schmorl's node projects superiorly along with a
shallow leftward protrusion. No impingement.

L1-L2: Shallow central protrusion. Minor endplate changes. No
impingement.

L2-L3:  Normal.

L3-L4:  Normal.

L4-L5: Small annular rent/subligamentous protrusion in the midline.
This is a chronic finding, unchanged from 3119 which is slightly
vascularized. Mild facet arthropathy. No impingement.

L5-S1: Status post RIGHT hemilaminotomy. Asymmetric loss of
interspace height on the RIGHT reflects prior discectomy. A small
annular rent is present but there is no recurrent protrusion of disc
material.

The RIGHT L5-S1 foramen is narrowed. This relates to loss of
right-sided interspace height, slight annular bulging, and RIGHT
greater than LEFT facet arthropathy. Symptomatic RIGHT L5 nerve root
impingement may be present. See for instance image 4 series 2, image
5 series 6, or image 31 series 4.
IMPRESSION: No recurrent protrusion of disc material at L5-S1.

Asymmetric right-sided foraminal narrowing at L5-S1 related to loss
of interspace height, slight annular bulging, and RIGHT greater than
LEFT facet arthropathy potentially affecting the RIGHT L5 nerve
root.

Small annular rent/subligamentous protrusion at L4-5 in the midline.
No impingement.

## 2020-09-07 ENCOUNTER — Ambulatory Visit (INDEPENDENT_AMBULATORY_CARE_PROVIDER_SITE_OTHER): Payer: BC Managed Care – PPO

## 2020-09-07 ENCOUNTER — Other Ambulatory Visit: Payer: Self-pay

## 2020-09-07 ENCOUNTER — Encounter (HOSPITAL_COMMUNITY): Payer: Self-pay

## 2020-09-07 ENCOUNTER — Ambulatory Visit (HOSPITAL_COMMUNITY)
Admission: EM | Admit: 2020-09-07 | Discharge: 2020-09-07 | Disposition: A | Discharge status: Home / Self Care | Payer: PRIVATE HEALTH INSURANCE | Attending: Student | Admitting: Student

## 2020-09-07 DIAGNOSIS — M25532 Pain in left wrist: Secondary | ICD-10-CM

## 2020-09-07 DIAGNOSIS — M19032 Primary osteoarthritis, left wrist: Secondary | ICD-10-CM | POA: Diagnosis not present

## 2020-09-07 DIAGNOSIS — W19XXXA Unspecified fall, initial encounter: Secondary | ICD-10-CM

## 2020-09-07 DIAGNOSIS — S52532A Colles' fracture of left radius, initial encounter for closed fracture: Secondary | ICD-10-CM

## 2020-09-07 MED ORDER — HYDROCODONE-ACETAMINOPHEN 5-325 MG PO TABS: 1.0000 | ORAL_TABLET | Freq: Four times a day (QID) | ORAL | 0 refills | Status: DC | PRN

## 2020-09-07 MED ORDER — KETOROLAC TROMETHAMINE 60 MG/2ML IM SOLN
60.0000 mg | Freq: Once | INTRAMUSCULAR | Status: AC
  Administered 2020-09-07: 60 mg via INTRAMUSCULAR

## 2020-09-07 MED ORDER — KETOROLAC TROMETHAMINE 60 MG/2ML IM SOLN
INTRAMUSCULAR | Status: AC
  Filled 2020-09-07: qty 2

## 2020-09-07 NOTE — Discharge Instructions (Addendum)
-  Use the hydrocodone-acetaminophen (Norco-Vicodin) every 6 hours as needed for pain.  This can cause drowsiness, so avoid taking if you have to drive or operate machinery. -Follow-up with orthopedist in the next 1 to 2 days for further evaluation management.  Information below.  Get help right away if: You have: A very bad headache that is not helped by medicine. Trouble walking or weakness in your arms and legs. Clear or bloody fluid coming from your nose or ears. Changes in how you see (vision). A seizure. More confusion or more grumpy moods. Your symptoms get worse. You are sleepier than normal and have trouble staying awake. You lose your balance. The black centers of your eyes (pupils) change in size. Your speech is slurred. Your dizziness gets worse. You vomit.

## 2020-09-07 NOTE — ED Provider Notes (Signed)
[facility]    CSN: [id number] Arrival date & time: 09/07/20  1549      History   Chief Complaint Chief Complaint  Patient presents with  . Hand Injury    Left hand/forearm     HPI [name] is a 50 y.o. female presenting with left forearm injury.  Medical history noncontributory.  States that about 2 hours ago, she fell onto her outstretched left hand.  States that she was rollerblading, and a child ran into her from behind, causing her to fall onto her outstretched left hand.  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  Some decreased sensation in her left thumb but states she can still feel everything.  She also hit her head on the way down, but denies loss of consciousness, dizziness, unusual drowsiness, headaches. Was not wearing helmet. Denies injury, pain, deformity, numbness/tingling elsewhere. 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  HPI  Past Medical History:  Diagnosis Date  . Umbilical hernia     Patient Active Problem List   Diagnosis Date Noted  . Umbilical hernia, reducible 01/05/2012  . 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 acdf    . CESAREAN SECTION  2008, 2010  . MICRODISCECTOMY LUMBAR    . TUBAL LIGATION  2010    OB History   No obstetric history on file.      Home Medications    Prior to Admission medications   Medication Sig Start Date End Date Taking? Authorizing Provider  HYDROcodone-acetaminophen (NORCO/VICODIN) 5-325 MG tablet Take 1-2 tablets by mouth every 6 (six) hours as needed. 09/07/20  Yes Rhys Martini, PA-C  loratadine-pseudoephedrine (CLARITIN-D 12-HOUR) 5-120 MG per tablet Take 1 tablet by mouth 2 (two) times daily.    [provider]    Family History Family History  Problem Relation Age of Onset  . Cancer Mother        melanoma     Social History Social History   Tobacco Use  . Smoking status: Never Smoker  Substance Use Topics  . Alcohol use: No  . Drug use: No     Allergies   Other, Brinzolamide, and Timolol   Review of Systems Review of Systems  Musculoskeletal:       L wrist pain, swelling and deformity  All other systems reviewed and are negative.    Physical Exam Triage Vital Signs ED Triage Vitals  Enc Vitals Group     BP      Pulse      Resp      Temp      Temp src      SpO2      Weight      Height      Head Circumference      Peak Flow      Pain Score      Pain Loc      Pain Edu?      Excl. in GC?    No data found.  Updated Vital Signs BP (!) 160/90 (BP Location: Right Arm)   Pulse 81   Temp 98.6 F (37 C) (Oral)   Resp 18   SpO2 100%   Visual Acuity Right Eye Distance:   Left Eye Distance:   Bilateral Distance:    Right Eye Near:   Left Eye Near:  Bilateral Near:     Physical Exam Vitals reviewed.  Constitutional:      Appearance: Normal appearance.  HENT:     Head: Normocephalic and atraumatic.     Comments: No bruising, swelling, ecchymosis of head or scalp.  Eyes:     Extraocular Movements: Extraocular movements intact.     Pupils: Pupils are equal, round, and reactive to light.  Cardiovascular:     Rate and Rhythm: Normal rate and regular rhythm.     Heart sounds: Normal heart sounds.  Pulmonary:     Effort: Pulmonary effort is normal.     Breath sounds: Normal breath sounds.  Abdominal:     Palpations: Abdomen is soft.     Tenderness: There is no abdominal tenderness. There is no guarding or rebound.  Musculoskeletal:     Cervical back: Neck supple.     Comments: L wrist with swelling and deformity over distal radius. Distal ulna with swelling but no deformity. Radial pulse 2+, cap refill <2 seconds all fingers. ROM wrist limited due to pain. ROM all fingers intact and without pain. No snuffbox tenderness. Sensation L thumb decreased but  intact. Neurovascularly intact.  No proximal radius or ulna tenderness/deformity. No elbow tenderness/deformity. No other tenderness, deformity, abrasion, laceration.  Skin:    Capillary Refill: Capillary refill takes less than 2 seconds.  Neurological:     General: No focal deficit present.     Mental Status: She is alert and oriented to person, place, and time.     Comments: CN 2-12 grossly intact.  Psychiatric:        Mood and Affect: Mood normal.        Behavior: Behavior normal.        Thought Content: Thought content normal.        Judgment: Judgment normal.      UC Treatments / Results  Labs (all labs ordered are listed, but only abnormal results are displayed) Labs Reviewed - No data to display  EKG   Radiology DG Wrist Complete Left  Addendum Date: 09/07/2020   ADDENDUM REPORT: 09/07/2020 19:53 ADDENDUM: Films were mislabeled. The study was of the left wrist. Please note that the fracture is within the distal left radius. Electronically Signed   By: Charlett Nose M.D.   On: 09/07/2020 19:53   Result Date: 09/07/2020 CLINICAL DATA:  Fall on outstretched hand.  Swelling, pain EXAM: LEFT WRIST - COMPLETE 3+ VIEW COMPARISON:  None. FINDINGS: There is a distal right radial fracture. Distal fragments are displaced and angulated posteriorly. No subluxation or dislocation. Overlying soft tissue swelling. IMPRESSION: Comminuted, displaced and angulated distal right radial fracture. Electronically Signed: By: Charlett Nose M.D. On: 09/07/2020 17:03    Procedures Procedures (including critical care time)  Medications Ordered in UC Medications  ketorolac (TORADOL) injection 60 mg (60 mg Intramuscular Given 09/07/20 1736)    Initial Impression / Assessment and Plan / UC Course  I have reviewed the triage vital signs and the nursing notes.  Pertinent labs & imaging results that were available during my care of the patient were reviewed by me and considered in my medical decision  making (see chart for details).     This patient is a 50 year old female presenting with L distal radius fracture. She is neurovascularly intact.  Xray wrist- Comminuted, displaced and angulated distal right radial fracture. Films interpreted by myself and radiologist.  Patient placed in sugar tong splint.  Follow-up with Ortho tomorrow. Hydrocodone sent. toradol administered today  in clinic.  Strict return precautions discussed. 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 She verbalizes understanding and agreement.  Low suspicion for intracranial bleed given normal neuro exam, no LOC. Red flag symptoms discussed.  This chart was dictated using voice recognition software, Dragon. Despite the best efforts of this provider to proofread and correct errors, errors may still occur which can change documentation meaning.   Final Clinical Impressions(s) / UC Diagnoses   Final diagnoses:  Closed Colles' fracture of left radius, initial encounter     Discharge Instructions     -Use the hydrocodone-acetaminophen (Norco-Vicodin) every 6 hours as needed for pain.  This can cause drowsiness, so avoid taking if you have to drive or operate machinery. -Follow-up with orthopedist in the next 1 to 2 days for further evaluation management.  Information below.  Get help right away if: 1. You have: ? A very bad headache that is not helped by medicine. ? Trouble walking or weakness in your arms and legs. ? Clear or bloody fluid coming from your nose or ears. ? Changes in how you see (vision). ? A seizure. ? More confusion or more grumpy moods. 2. Your symptoms get worse. 3. You are sleepier than normal and have trouble staying awake. 4. You lose your balance. 5. The black centers of your eyes (pupils) change in size. 6. Your speech is slurred. 7. Your dizziness gets worse. 8. You vomit.     ED Prescriptions    Medication Sig Dispense Auth. Provider   HYDROcodone-acetaminophen (NORCO/VICODIN)  5-325 MG tablet Take 1-2 tablets by mouth every 6 (six) hours as needed. 15 tablet Rhys Martini, PA-C     I have reviewed the PDMP during this encounter.   Rhys Martini, PA-C 09/08/20 [phone]

## 2020-09-07 NOTE — ED Triage Notes (Signed)
Pt present a left hand/wrist injury today from skating [place] the [place]. Pt state that a individual made her fall today.

## 2020-09-07 NOTE — Progress Notes (Signed)
Orthopedic Tech Progress Note Patient Details:  [name] [birth year] [id number]  Ortho Devices Type of Ortho Device: Arm sling,Sugartong splint Ortho Device/Splint Location: LUE Ortho Device/Splint Interventions: Ordered,Application,Adjustment   Post Interventions Patient Tolerated: Well Instructions Provided: Care of device   Donald Pore 09/07/2020, 5:58 PM

## 2020-09-08 ENCOUNTER — Encounter (HOSPITAL_COMMUNITY): Payer: Self-pay | Admitting: Orthopedic Surgery

## 2020-09-08 ENCOUNTER — Encounter: Payer: Self-pay | Admitting: Physician Assistant

## 2020-09-08 ENCOUNTER — Ambulatory Visit (INDEPENDENT_AMBULATORY_CARE_PROVIDER_SITE_OTHER): Payer: BC Managed Care – PPO | Admitting: Orthopedic Surgery

## 2020-09-08 DIAGNOSIS — S52532A Colles' fracture of left radius, initial encounter for closed fracture: Secondary | ICD-10-CM

## 2020-09-08 NOTE — Progress Notes (Signed)
[name] denies chest pain or shortness of breath, no s/s of Covid.Patient reports that she is fully vaccinated for Covid.  I informed [name] that she may have clear liquids until 0945, patient report that she drinks water and black coffee.

## 2020-09-09 ENCOUNTER — Encounter (HOSPITAL_COMMUNITY): Payer: Self-pay | Admitting: Orthopedic Surgery

## 2020-09-09 ENCOUNTER — Encounter (HOSPITAL_COMMUNITY)
Admission: RE | Disposition: A | Discharge status: Home / Self Care | Payer: Self-pay | Source: Home / Self Care | Attending: Orthopedic Surgery

## 2020-09-09 ENCOUNTER — Ambulatory Visit (HOSPITAL_COMMUNITY)
Admission: RE | Admit: 2020-09-09 | Discharge: 2020-09-09 | Disposition: A | Discharge status: Home / Self Care | Payer: BC Managed Care – PPO | Attending: Orthopedic Surgery | Admitting: Orthopedic Surgery

## 2020-09-09 ENCOUNTER — Ambulatory Visit (HOSPITAL_COMMUNITY): Payer: BC Managed Care – PPO | Admitting: Certified Registered Nurse Anesthetist

## 2020-09-09 ENCOUNTER — Encounter: Payer: Self-pay | Admitting: Orthopedic Surgery

## 2020-09-09 ENCOUNTER — Ambulatory Visit (HOSPITAL_COMMUNITY): Payer: BC Managed Care – PPO

## 2020-09-09 ENCOUNTER — Other Ambulatory Visit: Payer: Self-pay

## 2020-09-09 DIAGNOSIS — S52532A Colles' fracture of left radius, initial encounter for closed fracture: Secondary | ICD-10-CM

## 2020-09-09 DIAGNOSIS — W19XXXA Unspecified fall, initial encounter: Secondary | ICD-10-CM | POA: Diagnosis not present

## 2020-09-09 DIAGNOSIS — Z20822 Contact with and (suspected) exposure to covid-19: Secondary | ICD-10-CM | POA: Insufficient documentation

## 2020-09-09 DIAGNOSIS — S52532D Colles' fracture of left radius, subsequent encounter for closed fracture with routine healing: Secondary | ICD-10-CM | POA: Diagnosis not present

## 2020-09-09 DIAGNOSIS — S52502A Unspecified fracture of the lower end of left radius, initial encounter for closed fracture: Secondary | ICD-10-CM | POA: Insufficient documentation

## 2020-09-09 DIAGNOSIS — Z79899 Other long term (current) drug therapy: Secondary | ICD-10-CM | POA: Insufficient documentation

## 2020-09-09 HISTORY — PX: ORIF WRIST FRACTURE: SHX2133

## 2020-09-09 HISTORY — DX: Other specified postprocedural states: Z98.890

## 2020-09-09 HISTORY — DX: Nausea with vomiting, unspecified: R11.2

## 2020-09-09 LAB — BASIC METABOLIC PANEL
Anion gap: 9 (ref 5–15)
BUN: 9 mg/dL (ref 6–20)
CO2: 24 mmol/L (ref 22–32)
Calcium: 8.6 mg/dL — ABNORMAL LOW (ref 8.9–10.3)
Chloride: 104 mmol/L (ref 98–111)
Creatinine, Ser: 0.67 mg/dL (ref 0.44–1.00)
GFR, Estimated: >60 mL/min (ref >60)
Glucose, Bld: 83 mg/dL (ref 70–99)
Potassium: 3.1 mmol/L — ABNORMAL LOW (ref 3.5–5.1)
Sodium: 137 mmol/L (ref 135–145)

## 2020-09-09 LAB — CBC
HCT: 41.5 % (ref 36.0–46.0)
Hemoglobin: 13.9 g/dL (ref 12.0–15.0)
MCH: 30.1 pg (ref 26.0–34.0)
MCHC: 33.5 g/dL (ref 30.0–36.0)
MCV: 89.8 fL (ref 80.0–100.0)
Platelets: 206 10*3/uL (ref 150–400)
RBC: 4.62 MIL/uL (ref 3.87–5.11)
RDW: 12.4 % (ref 11.5–15.5)
WBC: 5.2 10*3/uL (ref 4.0–10.5)
nRBC: 0 % (ref 0.0–0.2)

## 2020-09-09 LAB — POCT PREGNANCY, URINE: Preg Test, Ur: NEGATIVE

## 2020-09-09 LAB — SARS CORONAVIRUS 2 BY RT PCR (HOSPITAL ORDER, PERFORMED IN ~~LOC~~ HOSPITAL LAB): SARS Coronavirus 2: NEGATIVE

## 2020-09-09 SURGERY — OPEN REDUCTION INTERNAL FIXATION (ORIF) WRIST FRACTURE
Anesthesia: Monitor Anesthesia Care | Site: Wrist | Laterality: Left

## 2020-09-09 MED ORDER — LIDOCAINE HCL (CARDIAC) PF 100 MG/5ML IV SOSY
PREFILLED_SYRINGE | INTRAVENOUS | Status: DC | PRN
  Administered 2020-09-09: 60 mg via INTRATRACHEAL

## 2020-09-09 MED ORDER — ONDANSETRON HCL 4 MG/2ML IJ SOLN: INTRAMUSCULAR | Status: DC | PRN

## 2020-09-09 MED ORDER — ONDANSETRON HCL 4 MG/2ML IJ SOLN: 4.0000 mg | INTRAMUSCULAR | Status: AC

## 2020-09-09 MED ORDER — MIDAZOLAM HCL 2 MG/2ML IJ SOLN
INTRAMUSCULAR | Status: AC
  Filled 2020-09-09: qty 2

## 2020-09-09 MED ORDER — PROPOFOL 500 MG/50ML IV EMUL
INTRAVENOUS | Status: DC | PRN
  Administered 2020-09-09: 100 ug/kg/min via INTRAVENOUS

## 2020-09-09 MED ORDER — PHENYLEPHRINE HCL-NACL 10-0.9 MG/250ML-% IV SOLN
INTRAVENOUS | Status: AC
  Filled 2020-09-09: qty 250

## 2020-09-09 MED ORDER — MIDAZOLAM HCL 2 MG/2ML IJ SOLN
INTRAMUSCULAR | Status: AC
  Administered 2020-09-09: 2 mg via INTRAVENOUS
  Filled 2020-09-09: qty 2

## 2020-09-09 MED ORDER — ROPIVACAINE HCL 5 MG/ML IJ SOLN
INTRAMUSCULAR | Status: DC | PRN
  Administered 2020-09-09: 30 mL via PERINEURAL

## 2020-09-09 MED ORDER — FENTANYL CITRATE (PF) 100 MCG/2ML IJ SOLN
INTRAMUSCULAR | Status: AC
  Filled 2020-09-09: qty 2

## 2020-09-09 MED ORDER — CHLORHEXIDINE GLUCONATE 0.12 % MT SOLN
15.0000 mL | Freq: Once | OROMUCOSAL | Status: AC
  Administered 2020-09-09: 15 mL via OROMUCOSAL
  Filled 2020-09-09: qty 15

## 2020-09-09 MED ORDER — KETOROLAC TROMETHAMINE 10 MG PO TABS: 10.0000 mg | ORAL_TABLET | Freq: Three times a day (TID) | ORAL | 0 refills | Status: DC | PRN

## 2020-09-09 MED ORDER — MIDAZOLAM HCL 2 MG/2ML IJ SOLN: 2.0000 mg | Freq: Once | INTRAMUSCULAR | Status: AC

## 2020-09-09 MED ORDER — OXYCODONE HCL 5 MG/5ML PO SOLN: 5.0000 mg | Freq: Once | ORAL | Status: DC | PRN

## 2020-09-09 MED ORDER — ONDANSETRON HCL 4 MG/2ML IJ SOLN: 4.0000 mg | Freq: Once | INTRAMUSCULAR | Status: DC | PRN

## 2020-09-09 MED ORDER — LACTATED RINGERS IV SOLN: INTRAVENOUS | Status: DC

## 2020-09-09 MED ORDER — FENTANYL CITRATE (PF) 100 MCG/2ML IJ SOLN
INTRAMUSCULAR | Status: DC | PRN
  Administered 2020-09-09: 50 ug via INTRAVENOUS

## 2020-09-09 MED ORDER — POVIDONE-IODINE 7.5 % EX SOLN: Freq: Once | CUTANEOUS | Status: DC

## 2020-09-09 MED ORDER — ONDANSETRON HCL 4 MG/2ML IJ SOLN
INTRAMUSCULAR | Status: AC
  Administered 2020-09-09: 4 mg via INTRAVENOUS
  Filled 2020-09-09: qty 2

## 2020-09-09 MED ORDER — VANCOMYCIN HCL 500 MG IV SOLR
INTRAVENOUS | Status: AC
  Filled 2020-09-09: qty 500

## 2020-09-09 MED ORDER — PROPOFOL 10 MG/ML IV BOLUS
INTRAVENOUS | Status: AC
  Filled 2020-09-09: qty 20

## 2020-09-09 MED ORDER — FENTANYL CITRATE (PF) 250 MCG/5ML IJ SOLN
INTRAMUSCULAR | Status: AC
  Filled 2020-09-09: qty 5

## 2020-09-09 MED ORDER — POVIDONE-IODINE 10 % EX SWAB
2.0000 "application " | Freq: Once | CUTANEOUS | Status: AC
  Administered 2020-09-09: 2 via TOPICAL

## 2020-09-09 MED ORDER — OXYCODONE-ACETAMINOPHEN 5-325 MG PO TABS: 1.0000 | ORAL_TABLET | ORAL | 0 refills | Status: AC | PRN

## 2020-09-09 MED ORDER — METHOCARBAMOL 500 MG PO TABS: 500.0000 mg | ORAL_TABLET | Freq: Three times a day (TID) | ORAL | 0 refills | Status: DC | PRN

## 2020-09-09 MED ORDER — OXYCODONE HCL 5 MG PO TABS
5.0000 mg | ORAL_TABLET | Freq: Once | ORAL | Status: DC | PRN
Start: 2020-09-09 — End: 2020-09-09

## 2020-09-09 MED ORDER — AMISULPRIDE (ANTIEMETIC) 5 MG/2ML IV SOLN: 10.0000 mg | Freq: Once | INTRAVENOUS | Status: DC | PRN

## 2020-09-09 MED ORDER — 0.9 % SODIUM CHLORIDE (POUR BTL) OPTIME
TOPICAL | Status: DC | PRN
  Administered 2020-09-09: 1000 mL

## 2020-09-09 MED ORDER — PHENYLEPHRINE HCL-NACL 10-0.9 MG/250ML-% IV SOLN
INTRAVENOUS | Status: AC
  Filled 2020-09-09: qty 500

## 2020-09-09 MED ORDER — CEFAZOLIN SODIUM-DEXTROSE 2-4 GM/100ML-% IV SOLN
2.0000 g | INTRAVENOUS | Status: AC
  Administered 2020-09-09: 2 g via INTRAVENOUS
  Filled 2020-09-09: qty 100

## 2020-09-09 MED ORDER — ORAL CARE MOUTH RINSE: 15.0000 mL | Freq: Once | OROMUCOSAL | Status: AC

## 2020-09-09 MED ORDER — FENTANYL CITRATE (PF) 100 MCG/2ML IJ SOLN
25.0000 ug | INTRAMUSCULAR | Status: DC | PRN
Start: 2020-09-09 — End: 2020-09-09

## 2020-09-09 SURGICAL SUPPLY — 76 items
BIT DRILL 2.2 SS TIBIAL (BIT) ×1 IMPLANT
BLADE SURG 10 STRL SS (BLADE) ×2 IMPLANT
BNDG CMPR 9X4 STRL LF SNTH (GAUZE/BANDAGES/DRESSINGS) ×1
BNDG ELASTIC 4X5.8 VLCR STR LF (GAUZE/BANDAGES/DRESSINGS) ×2 IMPLANT
BNDG ESMARK 4X9 LF (GAUZE/BANDAGES/DRESSINGS) ×1 IMPLANT
BNDG GAUZE ELAST 4 BULKY (GAUZE/BANDAGES/DRESSINGS) IMPLANT
CORD BIPOLAR FORCEPS 12FT (ELECTRODE) ×2 IMPLANT
COVER SURGICAL LIGHT HANDLE (MISCELLANEOUS) ×2 IMPLANT
COVER WAND RF STERILE (DRAPES) ×2 IMPLANT
CUFF TOURN SGL QUICK 18X4 (TOURNIQUET CUFF) ×2 IMPLANT
CUFF TOURN SGL QUICK 24 (TOURNIQUET CUFF)
CUFF TRNQT CYL 24X4X16.5-23 (TOURNIQUET CUFF) IMPLANT
DRAIN TLS ROUND 10FR (DRAIN) IMPLANT
DRAPE INCISE IOBAN 66X45 STRL (DRAPES) ×1 IMPLANT
DRAPE OEC MINIVIEW 54X84 (DRAPES) ×1 IMPLANT
DRAPE U-SHAPE 47X51 STRL (DRAPES) ×2 IMPLANT
DRIVER BIT SQUARE 1.7/2.2 (TRAUMA) ×1 IMPLANT
DRSG AQUACEL AG ADV 3.5X 6 (GAUZE/BANDAGES/DRESSINGS) ×1 IMPLANT
DRSG PAD ABDOMINAL 8X10 ST (GAUZE/BANDAGES/DRESSINGS) IMPLANT
DURAPREP 26ML APPLICATOR (WOUND CARE) ×2 IMPLANT
ELECT REM PT RETURN 9FT ADLT (ELECTROSURGICAL) ×2
ELECTRODE REM PT RTRN 9FT ADLT (ELECTROSURGICAL) ×1 IMPLANT
FACESHIELD WRAPAROUND (MASK) ×2 IMPLANT
FACESHIELD WRAPAROUND OR TEAM (MASK) ×1 IMPLANT
GAUZE SPONGE 4X4 12PLY STRL (GAUZE/BANDAGES/DRESSINGS) ×1 IMPLANT
GAUZE XEROFORM 1X8 LF (GAUZE/BANDAGES/DRESSINGS) IMPLANT
GLOVE BIO SURGEON ST LM GN SZ9 (GLOVE) ×2 IMPLANT
GLOVE ECLIPSE 8.0 STRL XLNG CF (GLOVE) ×2 IMPLANT
GLOVE SRG 8 PF TXTR STRL LF DI (GLOVE) ×1 IMPLANT
GLOVE SURG UNDER POLY LF SZ8 (GLOVE) ×2
GOWN STRL REUS W/ TWL LRG LVL3 (GOWN DISPOSABLE) ×2 IMPLANT
GOWN STRL REUS W/ TWL XL LVL3 (GOWN DISPOSABLE) ×1 IMPLANT
GOWN STRL REUS W/TWL LRG LVL3 (GOWN DISPOSABLE) ×6
GOWN STRL REUS W/TWL XL LVL3 (GOWN DISPOSABLE) ×2
K-WIRE 1.6 (WIRE) ×4
K-WIRE FX5X1.6XNS BN SS (WIRE) ×2
KIT BASIN OR (CUSTOM PROCEDURE TRAY) ×2 IMPLANT
KIT TURNOVER KIT B (KITS) ×2 IMPLANT
KWIRE FX5X1.6XNS BN SS (WIRE) IMPLANT
MANIFOLD NEPTUNE II (INSTRUMENTS) ×2 IMPLANT
NEEDLE 22X1 1/2 (OR ONLY) (NEEDLE) IMPLANT
NS IRRIG 1000ML POUR BTL (IV SOLUTION) ×2 IMPLANT
PACK ORTHO EXTREMITY (CUSTOM PROCEDURE TRAY) ×2 IMPLANT
PAD ARMBOARD 7.5X6 YLW CONV (MISCELLANEOUS) ×4 IMPLANT
PAD CAST 3X4 CTTN HI CHSV (CAST SUPPLIES) ×1 IMPLANT
PAD CAST 4YDX4 CTTN HI CHSV (CAST SUPPLIES) ×1 IMPLANT
PADDING CAST COTTON 3X4 STRL (CAST SUPPLIES) ×2
PADDING CAST COTTON 4X4 STRL (CAST SUPPLIES) ×2
PLATE STANDARD DVR LEFT (Plate) ×2 IMPLANT
PLATE STD DVR LT 24X51 (Plate) IMPLANT
SCREW 2.7X12MM (Screw) ×1 IMPLANT
SCREW 2.7X14MM (Screw) ×3 IMPLANT
SCREW BN 14X2.7XNONLOCK 3 LD (Screw) IMPLANT
SCREW LOCK 14X2.7X 3 LD TPR (Screw) IMPLANT
SCREW LOCK 16X2.7X 3 LD TPR (Screw) IMPLANT
SCREW LOCK 20X2.7X 3 LD TPR (Screw) IMPLANT
SCREW LOCKING 2.7X14 (Screw) ×2 IMPLANT
SCREW LOCKING 2.7X16 (Screw) ×8 IMPLANT
SCREW LOCKING 2.7X20MM (Screw) ×4 IMPLANT
SCREW NLOCK 2.7X14 (Screw) ×3 IMPLANT
SPONGE LAP 4X18 RFD (DISPOSABLE) ×4 IMPLANT
STAPLER VISISTAT 35W (STAPLE) IMPLANT
STRIP CLOSURE SKIN 1/2X4 (GAUZE/BANDAGES/DRESSINGS) ×2 IMPLANT
SUCTION FRAZIER HANDLE 10FR (MISCELLANEOUS) ×2
SUCTION TUBE FRAZIER 10FR DISP (MISCELLANEOUS) ×1 IMPLANT
SUT ETHILON 3 0 PS 1 (SUTURE) ×2 IMPLANT
SUT PROLENE 3 0 PS 1 (SUTURE) IMPLANT
SUT VIC AB 2-0 CTB1 (SUTURE) IMPLANT
SUT VIC AB 3-0 X1 27 (SUTURE) ×1 IMPLANT
SYR CONTROL 10ML LL (SYRINGE) IMPLANT
SYSTEM CHEST DRAIN TLS 7FR (DRAIN) IMPLANT
TOWEL GREEN STERILE (TOWEL DISPOSABLE) ×2 IMPLANT
TOWEL GREEN STERILE FF (TOWEL DISPOSABLE) ×2 IMPLANT
TUBE CONNECTING 12X1/4 (SUCTIONS) ×2 IMPLANT
WATER STERILE IRR 1000ML POUR (IV SOLUTION) ×2 IMPLANT
YANKAUER SUCT BULB TIP NO VENT (SUCTIONS) IMPLANT

## 2020-09-09 NOTE — H&P (Signed)
[name] is an 50 y.o. female.   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  She has displaced angulated extra-articular distal radius fracture and presents now for operative management.  She is right-hand dominant but she uses both hands for her ultrasound-guided procedures.  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  Past Medical History:  Diagnosis Date  . Umbilical hernia     Past Surgical History:  Procedure Laterality Date  . ABDOMINOPLASTY    . acdf    . CESAREAN SECTION  2008, 2010  . EYE SURGERY     catarct  . MICRODISCECTOMY LUMBAR    . TUBAL LIGATION  2010    Family History  Problem Relation Age of Onset  . Cancer Mother        melanoma   Social History:  reports that she has never smoked. She has never used smokeless tobacco. She reports that she does not drink alcohol and does not use drugs.  Allergies:  Allergies  Allergen Reactions  . Other Dermatitis    Allergic to DermaBond and BandAids  . Brinzolamide Other (See Comments)    Irritation - "uncomfortable and did not work that great"  . Timolol Other (See Comments)    Broncial spasms    Medications Prior to Admission  Medication Sig Dispense Refill  . albuterol (PROAIR HFA) 108 (90 Base) MCG/ACT inhaler 2 inhalations every 6 (six) hours as needed    . brimonidine (ALPHAGAN P) 0.1 % SOLN Apply to eye.    . Carboxymethylcellulose Sodium 1 % GEL Apply to eye.    . Dapsone 5 % topical gel dapsone 5 % topical gel  APPLY TOPICALLY TO THE AFFECTED AREA EVERY DAY    . desoximetasone (TOPICORT) 0.05 % cream desoximetasone 0.05 % topical cream  APPLY TOPICALLY TO THE AFFECTED AREA TWICE DAILY FOR 14 DAYS    . doxycycline (VIBRAMYCIN) 50 MG capsule doxycycline hyclate 50 mg capsule  TAKE 1 CAPSULE BY MOUTH EVERY DAY    . HYDROcodone-acetaminophen (NORCO/VICODIN) 5-325 MG tablet Take  1-2 tablets by mouth every 6 (six) hours as needed. 15 tablet 0  . ketotifen (ZADITOR) 0.025 % ophthalmic solution 1 drop every morning    . Lifitegrast (XIIDRA) 5 % SOLN Xiidra 5 % eye drops in a dropperette  INSTILL 1 DROP IN EACH EYE TWICE DAILY    . loratadine-pseudoephedrine (CLARITIN-D 12-HOUR) 5-120 MG per tablet Take 1 tablet by mouth 2 (two) times daily.    Marland Kitchen moxifloxacin (VIGAMOX) 0.5 % ophthalmic solution Apply to eye.    . Norethindrone-Ethinyl Estradiol-Fe Biphas (LO LOESTRIN FE) 1 MG-10 MCG / 10 MCG tablet     . olopatadine (PATANOL) 0.1 % ophthalmic solution Apply to eye.    . omega-3 fish oil (MAXEPA) 1000 MG CAPS capsule Take by mouth.    . sodium chloride (MURO 128) 5 % ophthalmic ointment Apply to eye.    . Tafluprost, PF, (ZIOPTAN) 0.0015 % SOLN Zioptan (PF) 0.0015 % eye drops in a dropperette  INSTILL 1 DROP IN BOTH EYES EVERY NIGHT    . tretinoin (RETIN-A) 0.025 % cream tretinoin 0.025 % topical cream  APPLY TOPICALLY TO FACE EVERY DAY IN THE EVENING/AT NIGHT    . zaleplon (SONATA) 10 MG capsule TK ONE C PO QHS    . zolpidem (AMBIEN) 10 MG tablet zolpidem 10 mg tablet  TAKE 1 TABLET BY  MOUTH EVERY NIGHT AT BEDTIME      No results found for this or any previous visit (from the past 48 hour(s)). DG Wrist Complete Left  Addendum Date: 09/07/2020   ADDENDUM REPORT: 09/07/2020 19:53 ADDENDUM: Films were mislabeled. The study was of the left wrist. Please note that the fracture is within the distal left radius. Electronically Signed   By: Charlett Nose M.D.   On: 09/07/2020 19:53   Result Date: 09/07/2020 CLINICAL DATA:  Fall on outstretched hand.  Swelling, pain EXAM: LEFT WRIST - COMPLETE 3+ VIEW COMPARISON:  None. FINDINGS: There is a distal right radial fracture. Distal fragments are displaced and angulated posteriorly. No subluxation or dislocation. Overlying soft tissue swelling. IMPRESSION: Comminuted, displaced and angulated distal right radial fracture. Electronically  Signed: By: Charlett Nose M.D. On: 09/07/2020 17:03   DG MINI C-ARM IMAGE ONLY  Result Date: 09/09/2020 There is no interpretation for this exam.  This order is for images obtained during a surgical procedure.  Please See "Surgeries" Tab for more information regarding the procedure.    Review of Systems  Musculoskeletal: Positive for arthralgias.  All other systems reviewed and are negative.   Blood pressure (!) 157/81, pulse 67, temperature 98.5 F (36.9 C), temperature source Oral, resp. rate 17, height 5\' 3"  (1.6 m), weight 56.7 kg, SpO2 100 %. Physical Exam Vitals reviewed.  HENT:     Head: Normocephalic.     Nose: Nose normal.     Mouth/Throat:     Mouth: Mucous membranes are moist.  Eyes:     Pupils: Pupils are equal, round, and reactive to light.  Cardiovascular:     Rate and Rhythm: Normal rate.     Pulses: Normal pulses.  Pulmonary:     Effort: Pulmonary effort is normal.  Abdominal:     General: Abdomen is flat.  Musculoskeletal:     Cervical back: Normal range of motion.  Skin:    General: Skin is warm.     Capillary Refill: Capillary refill takes less than 2 seconds.  Neurological:     General: No focal deficit present.     Mental Status: She is alert.  Psychiatric:        Mood and Affect: Mood normal.   Examination of the left wrist demonstrates intact EPL FPL interosseous function.  Mild swelling is present.  No fracture blisters.  Compartments are soft.  Radial pulse is intact.  Elbow shoulder range of motion intact on the left-hand side.  Assessment/Plan Impression is dorsally displaced distal radius fracture left hand.  Plan is open reduction internal fixation.  Risk benefits are discussed include not limited to infection nerve vessel damage wrist deafness as well as incomplete pain relief and possible development of arthritis down the road.  Patient understands risk benefits.  All questions answered.  Patient does have an important eye appointment tomorrow  at [facility] in I think is likely that the block will still be in effect at that time.  Plan for return office visit in 10 days for suture removal and initiation of range of motion exercises.  [facility], MD 09/09/2020, 10:25 AM

## 2020-09-09 NOTE — Anesthesia Postprocedure Evaluation (Signed)
Anesthesia Post Note  Patient: [name]  Procedure(s) Performed: OPEN REDUCTION INTERNAL FIXATION (ORIF) LEFT DISTAL RADIUS ORIF (Left Wrist)     Patient location during evaluation: PACU Anesthesia Type: Regional Level of consciousness: awake and alert Pain management: pain level controlled Vital Signs Assessment: post-procedure vital signs reviewed and stable Respiratory status: spontaneous breathing, nonlabored ventilation and respiratory function stable Cardiovascular status: blood pressure returned to baseline and stable Postop Assessment: no apparent nausea or vomiting Anesthetic complications: no   No complications documented.  Last Vitals:  Vitals:   09/09/20 1355 09/09/20 1410  BP: (!) 146/83 (!) 141/81  Pulse: (!) 57 (!) 59  Resp: 15 18  Temp:  (!) 36.1 C  SpO2: 100% 100%    Last Pain:  Vitals:   09/09/20 1410  TempSrc:   PainSc: 0-No pain                 Lucretia Kern

## 2020-09-09 NOTE — Brief Op Note (Signed)
   09/09/2020  1:44 PM  PATIENT:  [name]  50 y.o. female  PRE-OPERATIVE DIAGNOSIS:  left distal radius fracture  POST-OPERATIVE DIAGNOSIS:  left distal radius fracture  PROCEDURE:  Procedure(s): OPEN REDUCTION INTERNAL FIXATION (ORIF) LEFT DISTAL RADIUS ORIF  SURGEON:  Surgeon(s): Cammy Copa, MD  ASSISTANT: magnant pa  ANESTHESIA:   general  EBL: 10 ml    Total I/O In: 700 [I.V.:700] Out: 30 [Blood:30]  BLOOD ADMINISTERED: none  DRAINS: none   LOCAL MEDICATIONS USED:  vanco  SPECIMEN:  No Specimen  COUNTS:  YES  TOURNIQUET:   Total Tourniquet Time Documented: Upper Arm (Left) - 60 minutes Total: Upper Arm (Left) - 60 minutes   DICTATION: .Other Dictation: Dictation Number [id number]  PLAN OF CARE: Discharge to home after PACU  PATIENT DISPOSITION:  PACU - hemodynamically stable

## 2020-09-09 NOTE — Transfer of Care (Signed)
Immediate Anesthesia Transfer of Care Note  Patient: [name]  Procedure(s) Performed: OPEN REDUCTION INTERNAL FIXATION (ORIF) LEFT DISTAL RADIUS ORIF (Left Wrist)  Patient Location: PACU  Anesthesia Type:MAC and Regional  Level of Consciousness: awake, oriented, patient cooperative and responds to stimulation  Airway & Oxygen Therapy: Patient Spontanous Breathing and Patient connected to nasal cannula oxygen  Post-op Assessment: Report given to RN and Post -op Vital signs reviewed and stable  Post vital signs: Reviewed and stable  Last Vitals:  Vitals Value Taken Time  BP 126/85 09/09/20 1340  Temp    Pulse 39 09/09/20 1345  Resp 10 09/09/20 1346  SpO2 80 % 09/09/20 1345  Vitals shown include unvalidated device data.  Last Pain:  Vitals:   09/09/20 1110  TempSrc:   PainSc: 0-No pain      Patients Stated Pain Goal: 3 (10/93/23 5573)  Complications: No complications documented.

## 2020-09-09 NOTE — Anesthesia Preprocedure Evaluation (Addendum)
Anesthesia Evaluation  Patient identified by MRN, date of birth, ID band Patient awake    Reviewed: Allergy & Precautions, NPO status , Patient's Chart, lab work & pertinent test results  History of Anesthesia Complications (+) PONV and history of anesthetic complications  Airway Mallampati: II  TM Distance: >3 FB Neck ROM: Full    Dental   Pulmonary neg pulmonary ROS,    Pulmonary exam normal        Cardiovascular negative cardio ROS Normal cardiovascular exam     Neuro/Psych    GI/Hepatic negative GI ROS, Neg liver ROS,   Endo/Other  negative endocrine ROS  Renal/GU negative Renal ROS  negative genitourinary   Musculoskeletal negative musculoskeletal ROS (+)   Abdominal   Peds  Hematology negative hematology ROS (+)   Anesthesia Other Findings   Reproductive/Obstetrics                            Anesthesia Physical Anesthesia Plan  ASA: I  Anesthesia Plan: MAC and Regional   Post-op Pain Management:  Regional for Post-op pain   Induction: Intravenous  PONV Risk Score and Plan: 3 and Propofol infusion, TIVA, Treatment may vary due to age or medical condition, Ondansetron and Dexamethasone  Airway Management Planned: Natural Airway, Nasal Cannula and Simple Face Mask  Additional Equipment: None  Intra-op Plan:   Post-operative Plan:   Informed Consent: I have reviewed the patients History and Physical, chart, labs and discussed the procedure including the risks, benefits and alternatives for the proposed anesthesia with the patient or authorized representative who has indicated his/her understanding and acceptance.       Plan Discussed with:   Anesthesia Plan Comments:        Anesthesia Quick Evaluation

## 2020-09-09 NOTE — Anesthesia Procedure Notes (Signed)
Anesthesia Regional Block: Supraclavicular block   Pre-Anesthetic Checklist: ,, timeout performed, Correct Patient, Correct Site, Correct Laterality, Correct Procedure, Correct Position, site marked, Risks and benefits discussed,  Surgical consent,  Pre-op evaluation,  At surgeon's request and post-op pain management  Laterality: Left  Prep: chloraprep       Needles:  Injection technique: Single-shot  Needle Type: Echogenic Stimulator Needle     Needle Length: 10cm  Needle Gauge: 20     Additional Needles:   Procedures:,,,, ultrasound used (permanent image in chart),,,,  Narrative:  Start time: 09/09/2020 11:06 AM End time: 09/09/2020 11:13 AM Injection made incrementally with aspirations every 5 mL.  Performed by: Personally  Anesthesiologist: Lucretia Kern, MD  Additional Notes: Standard monitors applied. Skin prepped. Good needle visualization with ultrasound. Injection made in 5cc increments with no resistance to injection. Patient tolerated the procedure well.

## 2020-09-09 NOTE — Progress Notes (Signed)
Office Visit Note   Patient: [name]           Date of Birth: 08/16/1970           MRN: [id number] Visit Date: 09/08/2020 Requested by: Marcelle Overlie, MD [address] [place],  [place] [address] PCP: Marcelle Overlie, MD  Subjective: Chief Complaint  Patient presents with  . 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  She fell 09/07/2020 while rollerblading.  Fell on her outstretched hand.  Denies any elbow or shoulder symptoms.  No loss of consciousness.  Took 1 hydrocodone on the day of her clinic visit.  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  She has been doing some weightlifting recently as well.  Strength training also.  She does work in [place] about 3 days a week.  She is otherwise healthy.              ROS: All systems reviewed are negative as they relate to the chief complaint within the history of present illness.  Patient denies  fevers or chills.   Assessment & Plan: Visit Diagnoses: No diagnosis found.  Plan: Impression is displaced left distal radius fracture.  Plan is open reduction internal fixation to restore volar tilt height and inclination.  Risk benefits are discussed include not limited to infection nerve vessel damage wrist stiffness as well as potential for some long-term loss of motion.  Patient understands risk benefits.  Patient does have some swelling but no fracture blisters today.  Encouraged her to elevate overnight.  Plan for surgery tomorrow.  10-day return for suture removal and initiation of range of motion exercises.  Follow-Up Instructions: No follow-ups on file.   Orders:  No orders of the defined types were placed in this encounter.  No orders of the defined types were placed in this encounter.     Procedures: No procedures performed   Clinical Data: No additional findings.  Objective: Vital  Signs: There were no vitals taken for this visit.  Physical Exam:   Constitutional: Patient appears well-developed HEENT:  Head: Normocephalic Eyes:EOM are normal Neck: Normal range of motion Cardiovascular: Normal rate Pulmonary/chest: Effort normal Neurologic: Patient is alert Skin: Skin is warm Psychiatric: Patient has normal mood and affect    Ortho Exam: Ortho exam demonstrates full active and passive range of motion of the elbow and shoulder.  Left wrist has some swelling but EPL FPL interosseous strength is intact and radial pulses intact.  No definite paresthesias on the palmar or dorsal aspect of the hand.  Hand itself is perfused.  Specialty Comments:  No specialty comments available.  Imaging: No results found.   PMFS History: Patient Active Problem List   Diagnosis Date Noted  . Umbilical hernia, reducible 01/05/2012  . Rectus diastasis 01/05/2012   Past Medical History:  Diagnosis Date  . Umbilical hernia     Family History  Problem Relation Age of Onset  . Cancer Mother        melanoma    Past Surgical History:  Procedure Laterality Date  . ABDOMINOPLASTY    . acdf    . CESAREAN SECTION  2008, 2010  . EYE SURGERY     catarct  . MICRODISCECTOMY LUMBAR    . TUBAL LIGATION  2010   Social History   Occupational History  . Not on file  Tobacco Use  .  Smoking status: Never Smoker  . Smokeless tobacco: Not on file  Substance and Sexual Activity  . Alcohol use: No  . Drug use: No  . Sexual activity: Not on file

## 2020-09-09 NOTE — Anesthesia Procedure Notes (Signed)
Procedure Name: MAC Date/Time: 09/09/2020 11:45 AM Performed by: Michele Rockers, CRNA Pre-anesthesia Checklist: Patient identified, Emergency Drugs available, Suction available, Timeout performed and Patient being monitored Patient Re-evaluated:Patient Re-evaluated prior to induction Oxygen Delivery Method: Nasal Cannula

## 2020-09-10 ENCOUNTER — Encounter (HOSPITAL_COMMUNITY): Payer: Self-pay | Admitting: Orthopedic Surgery

## 2020-09-10 NOTE — Op Note (Signed)
NAMECHARI, [name] MEDICAL RECORD NO: [id number] ACCOUNT NO: [id number] DATE OF BIRTH: 1970/12/29 FACILITY: [facility] LOCATION: [facility] PHYSICIAN: Yetta Barre. Marlou Sa, MD  Operative Report   DATE OF PROCEDURE: 09/09/2020  PREOPERATIVE DIAGNOSIS:  Left distal radius fracture.  POSTOPERATIVE DIAGNOSIS:  Left distal radius fracture.  PROCEDURE:  Open reduction and internal fixation using DVR plate from [employer].  SURGEON:  Attending, Meredith Pel, MD  ASSISTANT:  Annie Main.  INDICATIONS:  The patient is a 50 year old patient with left distal radius fracture, displaced and angulated, who presents for operative management after explanation of risks and benefits.  DESCRIPTION OF PROCEDURE:  The patient was brought to the operating room where IV regional and MAC anesthetic was induced.  A timeout was called.  Left hand was pre-scrubbed with alcohol and Betadine and allowed to air dry.  Prepped with DuraPrep  solution and draped in a sterile manner.  Ioban used to cover the operative field.  Arm was elevated and exsanguinated with the Esmarch wrap.  Tourniquet was inflated.  Incision made over the FCR tendon.  Taken to the proximal wrist flexion crease,  extended proximally for about 7 cm.  Skin and subcutaneous tissue were sharply divided.  The FCR tendon was identified and the inferior sheath floor was also incised and the tendon, artery and nerve were mobilized radially.  Pronator quadratus was  identified and incised on the radial border of the distal radius.  Periosteal elevation was then performed.  Fracture was visualized.  Fracture was reduced.  A [employer] plate was applied and it took 3 attempts to get the plate in optimal position.   Once optimal position was obtained, screws were placed proximally and then distally.  Locking screws were chosen distally, which made it approximately 3/4 of the way across the joint surface for buttress and support.  Volar height, tilt and  inclination  were restored.  Good fixation was achieved.  The patient's bone quality was excellent.  Correct placement of screws and plate was confirmed in the AP and lateral planes under fluoroscopy.  Next, thorough irrigation was performed.  Tourniquet was  released, bleeding points encountered, controlled with bipolar electrocautery.  Vancomycin powder placed over the plate.  Incision was closed using 3-0 Vicryl, 3-0 nylon.  Aquacel dressing and volar splint applied.  The patient tolerated the procedure  well without immediate complication and was transferred to the recovery room in stable condition.  Luke's assistance was required for opening, closing, screw placement.  His assistance was a medical necessity.   SHW D: 09/09/2020 1:48:23 pm T: 09/10/2020 2:59:00 am  JOB: [id number]/ [id number]

## 2020-09-18 ENCOUNTER — Ambulatory Visit (INDEPENDENT_AMBULATORY_CARE_PROVIDER_SITE_OTHER): Payer: BC Managed Care – PPO

## 2020-09-18 ENCOUNTER — Encounter: Payer: Self-pay | Admitting: Orthopedic Surgery

## 2020-09-18 ENCOUNTER — Ambulatory Visit (INDEPENDENT_AMBULATORY_CARE_PROVIDER_SITE_OTHER): Payer: BC Managed Care – PPO | Admitting: Orthopedic Surgery

## 2020-09-18 ENCOUNTER — Other Ambulatory Visit: Payer: Self-pay

## 2020-09-18 DIAGNOSIS — Z8781 Personal history of (healed) traumatic fracture: Secondary | ICD-10-CM | POA: Diagnosis not present

## 2020-09-18 DIAGNOSIS — Z9889 Other specified postprocedural states: Secondary | ICD-10-CM

## 2020-09-18 NOTE — Progress Notes (Signed)
   Post-Op Visit Note   Patient: [name]           Date of Birth: 1971/05/30           MRN: [id number] Visit Date: 09/18/2020 PCP: Marcelle Overlie, MD   Assessment & Plan:  Chief Complaint:  Chief Complaint  Patient presents with  . Left Wrist - Routine Post Op   Visit Diagnoses:  1. S/P ORIF (open reduction internal fixation) fracture     Plan: [name] is now 9 days out open reduction internal fixation of left wrist fracture.  On exam incision is intact.  Pretty reasonable grip strength and good pronation supination as well as wrist flexion and extension.  Radiographs look good.  I will put her in a removable wrist splint.  Sutures removed today.  Steri-Strips applied.  Start outpatient occupational therapy for passive and active assisted wrist range of motion and grip strengthening.  No wrist flexion or extension strengthening yet.  Come back in 3 weeks for clinical recheck.  May or may not do radiographs at that time.  Bone quality was excellent so I think she can do reasonably well with range of motion.  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  Could be shorter depending on her recovery but I would expect 3 months at this point.  Follow-Up Instructions: Return in about 3 weeks (around 10/09/2020).   Orders:  Orders Placed This Encounter  Procedures  . XR Wrist Complete Left   No orders of the defined types were placed in this encounter.   Imaging: XR Wrist Complete Left  Result Date: 09/18/2020 AP lateral oblique left wrist reviewed.  Open reduction internal fixation in the interim has been performed.  Good restoration of volar tilt height and inclination.  No hardware complications.   PMFS History: Patient Active Problem List   Diagnosis Date Noted  . Fracture, Colles, left, closed   . Umbilical hernia, reducible 01/05/2012  . Rectus diastasis 01/05/2012   Past Medical History:   Diagnosis Date  . PONV (postoperative nausea and vomiting)   . Umbilical hernia     Family History  Problem Relation Age of Onset  . Cancer Mother        melanoma    Past Surgical History:  Procedure Laterality Date  . ABDOMINOPLASTY    . acdf    . CESAREAN SECTION  2008, 2010  . EYE SURGERY     catarct  . MICRODISCECTOMY LUMBAR    . ORIF WRIST FRACTURE Left 09/09/2020   Procedure: OPEN REDUCTION INTERNAL FIXATION (ORIF) LEFT DISTAL RADIUS ORIF;  Surgeon: Cammy Copa, MD;  Location: [facility] OR;  Service: Orthopedics;  Laterality: Left;  . TUBAL LIGATION  2010   Social History   Occupational History  . Not on file  Tobacco Use  . Smoking status: Never Smoker  . Smokeless tobacco: Never Used  Vaping Use  . Vaping Use: Never used  Substance and Sexual Activity  . Alcohol use: No  . Drug use: No  . Sexual activity: Not on file

## 2020-10-16 ENCOUNTER — Other Ambulatory Visit: Payer: Self-pay

## 2020-10-16 ENCOUNTER — Encounter: Payer: Self-pay | Admitting: Orthopedic Surgery

## 2020-10-16 ENCOUNTER — Ambulatory Visit (INDEPENDENT_AMBULATORY_CARE_PROVIDER_SITE_OTHER): Payer: BC Managed Care – PPO | Admitting: Orthopedic Surgery

## 2020-10-16 ENCOUNTER — Ambulatory Visit (INDEPENDENT_AMBULATORY_CARE_PROVIDER_SITE_OTHER): Payer: BC Managed Care – PPO

## 2020-10-16 DIAGNOSIS — Z8781 Personal history of (healed) traumatic fracture: Secondary | ICD-10-CM

## 2020-10-16 DIAGNOSIS — Z9889 Other specified postprocedural states: Secondary | ICD-10-CM | POA: Diagnosis not present

## 2020-10-16 DIAGNOSIS — M25542 Pain in joints of left hand: Secondary | ICD-10-CM | POA: Diagnosis not present

## 2020-10-16 NOTE — Progress Notes (Signed)
   Post-Op Visit Note   Patient: [name]           Date of Birth: 05-06-1971           MRN: [id number] Visit Date: 10/16/2020 PCP: Marcelle Overlie, MD   Assessment & Plan:  Chief Complaint:  Chief Complaint  Patient presents with  . Left Wrist - Follow-up   Visit Diagnoses:  1. S/P ORIF (open reduction internal fixation) fracture     Plan: Patient is a 50 year old patient who is now 4 weeks out from left wrist fracture progression internal fixation.  Patient's been doing well with therapy.  On exam she has excellent flexion and extension of the wrist only about 5 to 10 degrees off compared to the right-hand side.  Grip strength is improving.  Incision is intact.  Still hard for her to actually use the fork to feed herself and thus technical procedures will still be difficult at this time.  Plan at this time is to start some strengthening next week and hand therapy.  Come back in 4 weeks for clinical recheck.  Continue to work on dexterity exercises also.  Follow-Up Instructions: Return in about 4 weeks (around 11/13/2020).   Orders:  Orders Placed This Encounter  Procedures  . XR Wrist Complete Left   No orders of the defined types were placed in this encounter.   Imaging: XR Wrist Complete Left  Result Date: 10/16/2020 AP lateral oblique left wrist radiographs reviewed.  Hardware in good position alignment.  Fracture callus is noted dorsally.  No clear evidence of ulnar styloid fracture.  No lucencies around the distal screws.   PMFS History: Patient Active Problem List   Diagnosis Date Noted  . Fracture, Colles, left, closed   . Umbilical hernia, reducible 01/05/2012  . Rectus diastasis 01/05/2012   Past Medical History:  Diagnosis Date  . PONV (postoperative nausea and vomiting)   . Umbilical hernia     Family History  Problem Relation Age of Onset  . Cancer Mother        melanoma    Past Surgical History:  Procedure Laterality Date  . ABDOMINOPLASTY     . acdf    . CESAREAN SECTION  2008, 2010  . EYE SURGERY     catarct  . MICRODISCECTOMY LUMBAR    . ORIF WRIST FRACTURE Left 09/09/2020   Procedure: OPEN REDUCTION INTERNAL FIXATION (ORIF) LEFT DISTAL RADIUS ORIF;  Surgeon: Cammy Copa, MD;  Location: [facility] OR;  Service: Orthopedics;  Laterality: Left;  . TUBAL LIGATION  2010   Social History   Occupational History  . Not on file  Tobacco Use  . Smoking status: Never Smoker  . Smokeless tobacco: Never Used  Vaping Use  . Vaping Use: Never used  Substance and Sexual Activity  . Alcohol use: No  . Drug use: No  . Sexual activity: Not on file

## 2020-11-13 ENCOUNTER — Encounter: Payer: Self-pay | Admitting: Orthopedic Surgery

## 2020-11-13 ENCOUNTER — Ambulatory Visit: Payer: BC Managed Care – PPO | Admitting: Orthopedic Surgery

## 2020-11-13 DIAGNOSIS — Z8781 Personal history of (healed) traumatic fracture: Secondary | ICD-10-CM

## 2020-11-13 DIAGNOSIS — Z9889 Other specified postprocedural states: Secondary | ICD-10-CM

## 2020-11-13 NOTE — Progress Notes (Signed)
Post-Op Visit Note   Patient: [name]           Date of Birth: [birth year]           MRN: [id number] Visit Date: 11/13/2020 PCP: Marcelle Overlie, MD   Assessment & Plan:  Chief Complaint:  Chief Complaint  Patient presents with  . Left Wrist - Follow-up    09/09/2020 ORIF left distal radius   Visit Diagnoses:  1. 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  She is doing better.  Her therapist is doing an excellent job in getting her fine motor skills restored to a level so that she can resume work.  Patient has been working diligently at home with home exercise program as well.  She reports some occasional pain around the ulnar styloid.  Takes ibuprofen on most days.  No pain that wakes her from sleep at night.  She has been doing fine motor skill training with her kids as well.  On examination she has symmetric wrist extension and lacks only about 5 degrees of wrist flexion compared with the right arm.  Pronation supination is full.  Incision is intact.  Grip strength is about 10% different side to side left slightly weaker than right.  Impression is patient is doing very well with her range of motion and functional activities following left distal radius fracture.  Plan is to have her continue therapy for 1-2 more weeks to transition to a home exercise program focusing on strengthening.  Would not want her doing any heavy duty wrist loading such as push-ups or heavy bench press type weight lifting for at least 4 months postop.  She mentioned August which sounds like pretty reasonable time to consider return to those types of activities.  She may find that it could be difficult to return to push-ups and heavy weightlifting following the surgery.  Nonetheless I think she is functional at this time to return to work.  Follow-up as  needed.   Follow-Up Instructions: Return if symptoms worsen or fail to improve.   Orders:  No orders of the defined types were placed in this encounter.  No orders of the defined types were placed in this encounter.   Imaging: No results found.  PMFS History: Patient Active Problem List   Diagnosis Date Noted  . Fracture, Colles, left, closed   . Umbilical hernia, reducible 01/05/2012  . Rectus diastasis 01/05/2012   Past Medical History:  Diagnosis Date  . PONV (postoperative nausea and vomiting)   . Umbilical hernia     Family History  Problem Relation Age of Onset  . Cancer Mother        melanoma    Past Surgical History:  Procedure Laterality Date  . ABDOMINOPLASTY    . acdf    . CESAREAN SECTION  2008, 2010  . EYE SURGERY     catarct  . MICRODISCECTOMY LUMBAR    . ORIF WRIST FRACTURE Left 09/09/2020   Procedure: OPEN REDUCTION INTERNAL FIXATION (ORIF) LEFT DISTAL RADIUS ORIF;  Surgeon: Cammy Copa, MD;  Location: [facility] OR;  Service: Orthopedics;  Laterality: Left;  . TUBAL LIGATION  2010   Social History   Occupational History  . Not on file  Tobacco Use  . Smoking status: Never Smoker  . Smokeless tobacco: Never Used  Vaping Use  . Vaping Use:  Never used  Substance and Sexual Activity  . Alcohol use: No  . Drug use: No  . Sexual activity: Not on file

## 2023-03-08 ENCOUNTER — Ambulatory Visit: Payer: BC Managed Care – PPO | Admitting: Allergy & Immunology

## 2023-03-08 ENCOUNTER — Encounter: Payer: Self-pay | Admitting: Allergy & Immunology

## 2023-03-08 ENCOUNTER — Other Ambulatory Visit: Payer: Self-pay

## 2023-03-08 VITALS — BP 134/80 | HR 77 | Temp 98.4°F | Resp 18 | Ht 62.6 in | Wt 140.9 lb

## 2023-03-08 DIAGNOSIS — J302 Other seasonal allergic rhinitis: Secondary | ICD-10-CM | POA: Insufficient documentation

## 2023-03-08 DIAGNOSIS — L253 Unspecified contact dermatitis due to other chemical products: Secondary | ICD-10-CM | POA: Insufficient documentation

## 2023-03-08 DIAGNOSIS — J3089 Other allergic rhinitis: Secondary | ICD-10-CM | POA: Diagnosis not present

## 2023-03-08 DIAGNOSIS — J45998 Other asthma: Secondary | ICD-10-CM

## 2023-03-08 DIAGNOSIS — J45909 Unspecified asthma, uncomplicated: Secondary | ICD-10-CM | POA: Insufficient documentation

## 2023-03-08 MED ORDER — MONTELUKAST SODIUM 10 MG PO TABS: 10.0000 mg | ORAL_TABLET | Freq: Every day | ORAL | 1 refills | Status: DC

## 2023-03-08 MED ORDER — ZERVIATE 0.24 % OP SOLN: 1.0000 [drp] | Freq: Two times a day (BID) | OPHTHALMIC | 5 refills | Status: DC

## 2023-03-08 NOTE — Patient Instructions (Addendum)
1. Seasonal and perennial allergic rhinitis - Testing today showed: grasses, ragweed, weeds, trees, indoor molds, and outdoor molds - Copy of test results provided.  - Avoidance measures provided. - Stop taking: current medications - Start taking: Singulair (montelukast) 10mg  daily And Zerviate one drop per eye twice daily - Singulair can cause irritability and bad dreams, so beware of this and stop if this happens. - You can use an extra dose of the antihistamine, if needed, for breakthrough symptoms.  - Consider nasal saline rinses 1-2 times daily to remove allergens from the nasal cavities as well as help with mucous clearance (this is especially helpful to do before the nasal sprays are given) - Consider allergy shots as a means of long-term control. - Allergy shots "re-train" and "reset" the immune system to ignore environmental allergens and decrease the resulting immune response to those allergens (sneezing, itchy watery eyes, runny nose, nasal congestion, etc).    - Allergy shots improve symptoms in 75-85% of patients.  - We can discuss more at the next appointment if the medications are not working for you.  2. Return in about 3 months (around 06/07/2023). You can have the follow up appointment with Dr. Dellis Anes or a Nurse Practicioner (our Nurse Practitioners are excellent and always have Physician oversight!).    Please inform us of any Emergency Department visits, hospitalizations, or changes in symptoms. Call us before going to the ED for breathing or allergy symptoms since we might be able to fit you in for a sick visit. Feel free to contact us anytime with any questions, problems, or concerns.  It was a pleasure to meet you today!  Websites that have reliable patient information: 1. American Academy of Asthma, Allergy, and Immunology: www.aaaai.org 2. Food Allergy Research and Education (FARE): foodallergy.org 3. Mothers of Asthmatics: http://www.asthmacommunitynetwork.org 4.  American College of Allergy, Asthma, and Immunology: www.acaai.org   COVID-19 Vaccine Information can be found at: [email or web address] For questions related to vaccine distribution or appointments, please email [email or web address] or call [phone].     "Like" [place] on Facebook and Instagram for our latest updates!      A healthy democracy works best when [employer] participate! Make sure you are registered to vote! If you have moved or changed any of your contact information, you will need to get this updated before voting! Scan the QR codes below to learn more!       Reducing Pollen Exposure  The American Academy of Allergy, Asthma and Immunology suggests the following steps to reduce your exposure to pollen during allergy seasons.    Do not hang sheets or clothing out to dry; pollen may collect on these items. Do not mow lawns or spend time around freshly cut grass; mowing stirs up pollen. Keep windows closed at night.  Keep car windows closed while driving. Minimize morning activities outdoors, a time when pollen counts are usually at their highest. Stay indoors as much as possible when pollen counts or humidity is high and on windy days when pollen tends to remain in the air longer. Use air conditioning when possible.  Many air conditioners have filters that trap the pollen spores. Use a HEPA room air filter to remove pollen form the indoor air you breathe.  Control of Mold Allergen   Mold and fungi can grow on a variety of surfaces provided certain temperature and moisture conditions exist.  Outdoor molds grow on plants, decaying vegetation and soil.  The major outdoor mold, Alternaria and Cladosporium, are found  in very high numbers during hot and dry conditions.  Generally, a late Summer - Fall peak is seen for common outdoor fungal spores.  Rain will temporarily lower outdoor mold spore count, but counts rise rapidly  when the rainy period ends.  The most important indoor molds are Aspergillus and Penicillium.  Dark, humid and poorly ventilated basements are ideal sites for mold growth.  The next most common sites of mold growth are the bathroom and the kitchen.  Outdoor (Seasonal) Mold Control   Use air conditioning and keep windows closed Avoid exposure to decaying vegetation. Avoid leaf raking. Avoid grain handling. Consider wearing a face mask if working in moldy areas.    Indoor (Perennial) Mold Control    Maintain humidity below 50%. Clean washable surfaces with 5% bleach solution. Remove sources e.g. contaminated carpets.   Allergy Shots  Allergies are the result of a chain reaction that starts in the immune system. Your immune system controls how your body defends itself. For instance, if you have an allergy to pollen, your immune system identifies pollen as an invader or allergen. Your immune system overreacts by producing antibodies called Immunoglobulin E (IgE). These antibodies travel to cells that release chemicals, causing an allergic reaction.  The concept behind allergy immunotherapy, whether it is received in the form of shots or tablets, is that the immune system can be desensitized to specific allergens that trigger allergy symptoms. Although it requires time and patience, the payback can be long-term relief. Allergy injections contain a dilute solution of those substances that you are allergic to based upon your skin testing and allergy history.   How Do Allergy Shots Work?  Allergy shots work much like a vaccine. Your body responds to injected amounts of a particular allergen given in increasing doses, eventually developing a resistance and tolerance to it. Allergy shots can lead to decreased, minimal or no allergy symptoms.  There generally are two phases: build-up and maintenance. Build-up often ranges from three to six months and involves receiving injections with increasing  amounts of the allergens. The shots are typically given once or twice a week, though more rapid build-up schedules are sometimes used.  The maintenance phase begins when the most effective dose is reached. This dose is different for each person, depending on how allergic you are and your response to the build-up injections. Once the maintenance dose is reached, there are longer periods between injections, typically two to four weeks.  Occasionally doctors give cortisone-type shots that can temporarily reduce allergy symptoms. These types of shots are different and should not be confused with allergy immunotherapy shots.  Who Can Be Treated with Allergy Shots?  Allergy shots may be a good treatment approach for people with allergic rhinitis (hay fever), allergic asthma, conjunctivitis (eye allergy) or stinging insect allergy.   Before deciding to begin allergy shots, you should consider:   The length of allergy season and the severity of your symptoms  Whether medications and/or changes to your environment can control your symptoms  Your desire to avoid long-term medication use  Time: allergy immunotherapy requires a major time commitment  Cost: may vary depending on your insurance coverage  Allergy shots for children age 19 and older are effective and often well tolerated. They might prevent the onset of new allergen sensitivities or the progression to asthma.  Allergy shots are not started on patients who are pregnant but can be continued on patients who become pregnant while receiving them. In some patients with other  medical conditions or who take certain common medications, allergy shots may be of risk. It is important to mention other medications you talk to your allergist.   What are the two types of build-ups offered:   RUSH or Rapid Desensitization -- one day of injections lasting from 8:30-4:30pm, injections every 1 hour.  Approximately half of the build-up process is completed in  that one day.  The following week, normal build-up is resumed, and this entails ~16 visits either weekly or twice weekly, until reaching your "maintenance dose" which is continued weekly until eventually getting spaced out to every month for a duration of 3 to 5 years. The regular build-up appointments are nurse visits where the injections are administered, followed by required monitoring for 30 minutes.    Traditional build-up -- weekly visits for 6 -12 months until reaching "maintenance dose", then continue weekly until eventually spacing out to every 4 weeks as above. At these appointments, the injections are administered, followed by required monitoring for 30 minutes.     Either way is acceptable, and both are equally effective. With the rush protocol, the advantage is that less time is spent here for injections overall AND you would also reach maintenance dosing faster (which is when the clinical benefit starts to become more apparent). Not everyone is a candidate for rapid desensitization.   IF we proceed with the RUSH protocol, there are premedications which must be taken the day before and the day after the rush only (this includes antihistamines, steroids, and Singulair).  After the rush day, no prednisone or Singulair is required, and we just recommend antihistamines taken on your injection day.  What Is An Estimate of the Costs?  If you are interested in starting allergy injections, please check with your insurance company about your coverage for both allergy vial sets and allergy injections.  Please do so prior to making the appointment to start injections.  The following are CPT codes to give to your insurance company. These are the amounts we BILL to the insurance company, but the amount YOU WILL PAY and WE RECEIVE IS SUBSTANTIALLY LESS and depends on the contracts we have with different insurance companies.   Amount Billed to Insurance One allergy vial set  CPT 95165   $ 1200     Two  allergy vial set  CPT 95165   $ 2400     Three allergy vial set  CPT 95165   $ 3600     One injection   CPT 95115   $ 35  Two injections   CPT 95117   $ 40 RUSH (Rapid Desensitization) CPT 95180 x 8 hours $500/hour  Regarding the allergy injections, your co-pay may or may not apply with each injection, so please confirm this with your insurance company. When you start allergy injections, 1 or 2 sets of vials are made based on your allergies.  Not all patients can be on one set of vials. A set of vials lasts 6 months to a year depending on how quickly you can proceed with your build-up of your allergy injections. Vials are personalized for each patient depending on their specific allergens.  How often are allergy injection given during the build-up period?   Injections are given at least weekly during the build-up period until your maintenance dose is achieved. Per the doctor's discretion, you may have the option of getting allergy injections two times per week during the build-up period. However, there must be at least 48 hours between  injections. The build-up period is usually completed within 6-12 months depending on your ability to schedule injections and for adjustments for reactions. When maintenance dose is reached, your injection schedule is gradually changed to every two weeks and later to every three weeks. Injections will then continue every 4 weeks. Usually, injections are continued for a total of 3-5 years.   When Will I Feel Better?  Some may experience decreased allergy symptoms during the build-up phase. For others, it may take as long as 12 months on the maintenance dose. If there is no improvement after a year of maintenance, your allergist will discuss other treatment options with you.  If you aren't responding to allergy shots, it may be because there is not enough dose of the allergen in your vaccine or there are missing allergens that were not identified during your allergy  testing. Other reasons could be that there are high levels of the allergen in your environment or major exposure to non-allergic triggers like tobacco smoke.  What Is the Length of Treatment?  Once the maintenance dose is reached, allergy shots are generally continued for three to five years. The decision to stop should be discussed with your allergist at that time. Some people may experience a permanent reduction of allergy symptoms. Others may relapse and a longer course of allergy shots can be considered.  What Are the Possible Reactions?  The two types of adverse reactions that can occur with allergy shots are local and systemic. Common local reactions include very mild redness and swelling at the injection site, which can happen immediately or several hours after. Report a delayed reaction from your last injection. These include arm swelling or runny nose, watery eyes or cough that occurs within 12-24 hours after injection. A systemic reaction, which is less common, affects the entire body or a particular body system. They are usually mild and typically respond quickly to medications. Signs include increased allergy symptoms such as sneezing, a stuffy nose or hives.   Rarely, a serious systemic reaction called anaphylaxis can develop. Symptoms include swelling in the throat, wheezing, a feeling of tightness in the chest, nausea or dizziness. Most serious systemic reactions develop within 30 minutes of allergy shots. This is why it is strongly recommended you wait in your doctor's office for 30 minutes after your injections. Your allergist is trained to watch for reactions, and his or her staff is trained and equipped with the proper medications to identify and treat them.   Report to the nurse immediately if you experience any of the following symptoms: swelling, itching or redness of the skin, hives, watery eyes/nose, breathing difficulty, excessive sneezing, coughing, stomach pain, diarrhea, or  light headedness. These symptoms may occur within 15-20 minutes after injection and may require medication.   Who Should Administer Allergy Shots?  The preferred location for receiving shots is your prescribing allergist's office. Injections can sometimes be given at another facility where the physician and staff are trained to recognize and treat reactions, and have received instructions by your prescribing allergist.  What if I am late for an injection?   Injection dose will be adjusted depending upon how many days or weeks you are late for your injection.   What if I am sick?   Please report any illness to the nurse before receiving injections. She may adjust your dose or postpone injections depending on your symptoms. If you have fever, flu, sinus infection or chest congestion it is best to postpone allergy injections until  you are better. Never get an allergy injection if your asthma is causing you problems. If your symptoms persist, seek out medical care to get your health problem under control.  What If I am or Become Pregnant:  Women that become pregnant should schedule an appointment with [facility] before receiving any further allergy injections.

## 2023-03-08 NOTE — Progress Notes (Signed)
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 Seasonal and perennial allergic rhinitis - Testing today showed: grasses, ragweed, weeds, trees, indoor molds, and outdoor molds - Copy of test results provided.  - Avoidance measures provided. - Stop taking: current medications - Start taking: Singulair (montelukast) 10mg  daily And Zerviate one drop per eye twice daily - Singulair can cause irritability and bad dreams, so beware of this and stop if this happens. - You can use an extra dose of the antihistamine, if needed, for breakthrough symptoms.  - Consider nasal saline rinses 1-2 times daily to remove allergens from the nasal cavities as well as help with mucous clearance (this is especially helpful to do before the nasal sprays are given) - Consider allergy shots as a means of long-term control. - Allergy shots "re-train" and "reset" the immune system to ignore environmental allergens and decrease the resulting immune response to those allergens (sneezing, itchy watery eyes, runny nose, nasal congestion, etc).    - Allergy shots improve symptoms in 75-85% of patients.  - We can discuss more at the next appointment if the medications are not working for you. - We also discussed using a compounding pharmacy, although not sure they would be able to procure the active ingredients from [facility].  2. Return in about 3 months (around 06/07/2023). You can have the follow up appointment with Dr. Dellis Anes or a Nurse Practicioner (our Nurse Practitioners are excellent and always have Physician oversight!).    This note in its entirety was forwarded to the Provider who requested this  consultation.  Subjective:   [name] is a 52 y.o. female presenting today for evaluation of  Chief Complaint  Patient presents with   Allergic Rhinitis     Has been using Claritin a lot more - itchy water eyes year around - unable to use eye drops due to her allergy and is unable to take steroids     [name] has a history of the following: Patient Active Problem List   Diagnosis Date Noted   Fracture, Colles, left, closed    Umbilical hernia, reducible 01/05/2012   Rectus diastasis 01/05/2012    History obtained from: chart review and patient.  [name] was referred by Marcelle Overlie, MD.     [name] is a 52 y.o. female presenting for an evaluation of environmental allergies .    She started having seasonal allergies over the last couple of decades. It was mostly nonexistent when she left in [place]. Then she moved to [place] and she developed some tree allergies.  Her symptoms have been increasing over the course of the last several years.  She has been increasing wit she has never been tested in the past.    Her symptoms are mostly concentrated on the nasal passages as well as the eyes.  She was using [employer] over-the-counter preservative-free with good results, but this is better removed from the market.  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  These are present of essentially every multiuse eyedrop, according to the patient.   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  She is very  sensitive to nasal steroids, so she cannot use these.  She is very good at avoiding other sensitizers including laundry detergents and soaps.  She uses Free and clear laundry detergent.  She is a Marine scientist. She worked with [employer] until 2016 and then she started working in [place]. She has been here since 2006 or so.  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 She was previously working with staff and patients (she worked directly with patients).    Asthma/Respiratory Symptom History: She had asthma as a child and rarely uses her albuterol when she has a viral URI. She did react to timolol and steroids. Flonase would elevated her intraocular pressure.  Claritin does not helped her symptoms. She does report sleepiness with Xyzal and Zyrtec. The stuffiness has gotten worse overnight. She has woken herself up snoring.   She has not tried Singulair.  She feels that anything aside from Claritin makes her overly sleepy.  She has no problems with her immune system.  She does not get infections very often at all.  Otherwise, there is no history of other atopic diseases, including food allergies, drug allergies, stinging insect allergies, or contact dermatitis. There is no significant infectious history. Vaccinations are up to date.    Past Medical History: Patient Active Problem List   Diagnosis Date Noted   Fracture, Colles, left, closed    Umbilical hernia, reducible 01/05/2012   Rectus diastasis 01/05/2012    Medication List:  Allergies as of 03/08/2023       Reactions   Other Dermatitis   Allergic to DermaBond and BandAids   Brinzolamide Other (See Comments)   Irritation - "uncomfortable and did not work that great"   Timolol Other (See Comments)   Broncial spasms        Medication List        Accurate as of March 08, 2023  3:19 PM. If you have any questions, ask your nurse or doctor.          carboxymethylcellulose 0.5 % Soln Commonly known as: REFRESH PLUS Place 1 drop into both eyes 4 (four) times daily as needed (dry eyes).   Dapsone 5 % topical gel Apply 1 application topically daily.   doxycycline 50 MG capsule Commonly known as: VIBRAMYCIN Take 50 mg by mouth at bedtime.   ketorolac 10 MG tablet Commonly  known as: TORADOL Take 1 tablet (10 mg total) by mouth every 8 (eight) hours as needed.   loratadine 10 MG tablet Commonly known as: CLARITIN Take 10 mg by mouth daily.   methocarbamol 500 MG tablet Commonly known as: Robaxin Take 1 tablet (500 mg total) by mouth every 8 (eight) hours as needed.   montelukast 10 MG tablet Commonly known as: Singulair Take 1 tablet (10 mg total) by mouth at bedtime. Started by: Alfonse Spruce   norethindrone-ethinyl estradiol-FE 1-20 MG-MCG tablet Commonly known as: LOESTRIN FE Take 1 tablet by mouth daily.   omega-3 fish oil 1000 MG Caps capsule Commonly known as: MAXEPA Take 1,000 mg by mouth daily.   tretinoin 0.025 % cream Commonly known as: RETIN-A Apply 1 application topically at bedtime.   Xiidra 5 % Soln Generic drug: Lifitegrast Place 1 drop into both eyes 2 (two) times daily.   Zerviate 0.24 % Soln Generic drug: Cetirizine HCl Apply 1 drop to eye in the morning and at bedtime. Started by:  Alfonse Spruce   Zioptan 0.0015 % Soln Generic drug: Tafluprost (PF) Place 1 drop into both eyes at bedtime.        Birth History: non-contributory  Developmental History: non-contributory  Past Surgical History: Past Surgical History:  Procedure Laterality Date   ABDOMINOPLASTY     acdf     CESAREAN SECTION  2008, 2010   EYE SURGERY     catarct   MICRODISCECTOMY LUMBAR     ORIF WRIST FRACTURE Left 09/09/2020   Procedure: OPEN REDUCTION INTERNAL FIXATION (ORIF) LEFT DISTAL RADIUS ORIF;  Surgeon: Cammy Copa, MD;  Location: [facility] OR;  Service: Orthopedics;  Laterality: Left;   TUBAL LIGATION  2010     Family History: Family History  Problem Relation Age of Onset   Cancer Mother        melanoma     Social History: Kadejah lives at home with her family.  They live in a house that was built in 1994.  There is 80% hardwood with area rugs and then [employer] in the remaining areas.  They have gas  heating and central cooling.  There are no animals inside or outside of the home.  There are dust mite covers on the bed as well as the pillow.  She is not exposed to tobacco at all.  She does not have any fume, chemical, or dust exposure.  She does not have a HEPA filter in her home.   Review of systems otherwise negative other than that mentioned in the HPI.    Objective:   Blood pressure 134/80, pulse 77, temperature 98.4 F (36.9 C), resp. rate 18, height 5' 2.6" (1.59 m), weight 140 lb 14.4 oz (63.9 kg), SpO2 99%. Body mass index is 25.28 kg/m.     Physical Exam Vitals reviewed.  Constitutional:      Appearance: Normal appearance. She is well-developed and normal weight. She is not ill-appearing or toxic-appearing.     Comments: Very talkative.  HENT:     Head: Normocephalic and atraumatic.     Right Ear: Tympanic membrane, ear canal and external ear normal. No drainage, swelling or tenderness. Tympanic membrane is not injected, scarred, erythematous, retracted or bulging.     Left Ear: Tympanic membrane, ear canal and external ear normal. No drainage, swelling or tenderness. Tympanic membrane is not injected, scarred, erythematous, retracted or bulging.     Nose: No nasal deformity, septal deviation, mucosal edema or rhinorrhea.     Right Turbinates: Swollen and pale.     Left Turbinates: Swollen and pale.     Right Sinus: No maxillary sinus tenderness or frontal sinus tenderness.     Left Sinus: No maxillary sinus tenderness or frontal sinus tenderness.     Comments: No nasal polyps.    Mouth/Throat:     Lips: Pink.     Mouth: Mucous membranes are moist. Mucous membranes are not pale and not dry.     Pharynx: Uvula midline.     Comments: Minimal cobblestoning. Eyes:     General:        Right eye: No discharge.        Left eye: No discharge.     Conjunctiva/sclera: Conjunctivae normal.     Right eye: Right conjunctiva is not injected. No chemosis.    Left eye: Left  conjunctiva is not injected. No chemosis.    Pupils: Pupils are equal, round, and reactive to light.  Cardiovascular:     Rate and Rhythm: Normal  rate and regular rhythm.     Heart sounds: Normal heart sounds.  Pulmonary:     Effort: Pulmonary effort is normal. No tachypnea, accessory muscle usage or respiratory distress.     Breath sounds: Normal breath sounds. No wheezing, rhonchi or rales.  Chest:     Chest wall: No tenderness.  Lymphadenopathy:     Head:     Right side of head: No submandibular, tonsillar or occipital adenopathy.     Left side of head: No submandibular, tonsillar or occipital adenopathy.     Cervical: No cervical adenopathy.  Skin:    Coloration: Skin is not pale.     Findings: No abrasion, erythema, petechiae or rash. Rash is not papular, urticarial or vesicular.  Neurological:     Mental Status: She is alert.  Psychiatric:        Behavior: Behavior is cooperative.      Diagnostic studies:     Allergy Studies:     Airborne Adult Perc - 03/08/23 1404     Time Antigen Placed 1404    Allergen Manufacturer Waynette Buttery    Location Back    Number of Test 55    1. Control-Buffer 50% Glycerol Negative    2. Control-Histamine 2+    3. Bahia 2+    4. [place] 2+    5. Johnson Negative    6. [place] Blue 2+    7. Meadow Fescue 3+    8. Perennial Rye 2+    9. Timothy Negative    10. Ragweed Mix 3+    11. Cocklebur 2+    12. Plantain,  English 2+    13. Baccharis Negative    14. Dog Fennel Negative    15. Guernsey Thistle 2+    16. Lamb's Quarters Negative    17. Sheep Sorrell 2+    18. Rough Pigweed 2+    19. Marsh Elder, Rough 3+    20. Mugwort, Common 2+    21. Box, Elder 2+    22. Cedar, red 2+    23. Sweet Gum 3+    24. Pecan Pollen 2+    25. Pine Mix 3+    26. Walnut, Black Pollen 3+    27. Red Mulberry 3+    28. Ash Mix 4+    29. Birch Mix 3+    30. Beech American 3+    31. Cottonwood, [place] 2+    32. Hickory, White 2+    33. Maple Mix 3+     34. Oak, [place] Mix 4+    35. Sycamore Eastern 2+    36. Alternaria Alternata 3+    37. Cladosporium Herbarum Negative    38. Aspergillus Mix 3+    39. Penicillium Mix 2+    40. Bipolaris Sorokiniana (Helminthosporium) 3+    41. Drechslera Spicifera (Curvularia) 2+    42. Mucor Plumbeus Negative    43. Fusarium Moniliforme 4+    44. Aureobasidium Pullulans (pullulara) 4+    45. Rhizopus Oryzae 2+    46. Botrytis Cinera 2+    47. Epicoccum Nigrum 2+    48. Phoma Betae Negative    49. Dust Mite Mix Negative    50. Cat Hair 10,000 BAU/ml Negative    51.  Dog Epithelia Negative    52. Mixed Feathers Negative    53. Horse Epithelia Negative    54. Cockroach, German Negative    55. Tobacco Leaf Negative  Intradermal - 03/08/23 1435     Time Antigen Placed 1435    Allergen Manufacturer Waynette Buttery    Location Arm    Number of Test 5    Control Negative    Mite Mix Negative    Cat Negative    Dog Negative    Cockroach Negative             Allergy testing results were read and interpreted by myself, documented by clinical staff.         Malachi Bonds, MD [facility]

## 2023-03-11 ENCOUNTER — Telehealth: Payer: Self-pay

## 2023-03-11 NOTE — Telephone Encounter (Signed)
Would we be able to do a Prior Authorization to obtain coverage since she is allergic to the alternatives?

## 2023-03-11 NOTE — Telephone Encounter (Signed)
Received fax from [facility] - DOB verified - advising:  Cetirizine (Zerviate) 0.24% ophthalmic Solution is not covered by patient plan.  The preferred alternatives:  Azelastine HCL Bepotastine Besilate Cromolyn Sodium Epinastine HCL  Olopatadine HCL  Forwarding message to provider for next step.

## 2023-03-11 NOTE — Telephone Encounter (Signed)
She is allergic to a preservative in each of those options. I believe she was willing to pay whatever for the Zerviate (this is dispensed in single serve bullets).

## 2023-03-14 ENCOUNTER — Other Ambulatory Visit (HOSPITAL_COMMUNITY): Payer: Self-pay

## 2023-03-14 ENCOUNTER — Telehealth: Payer: Self-pay

## 2023-03-14 NOTE — Telephone Encounter (Signed)
So the PA was approved but it will still cost $170 for the patient to receive the medication.

## 2023-03-14 NOTE — Telephone Encounter (Signed)
Yes, since the patient is allergic to an ingredient in each of the alternatives we can try to do a PA for the Zerviate!

## 2023-03-14 NOTE — Telephone Encounter (Signed)
*  Asthma/Allergy  Pharmacy Patient Advocate Encounter  Received notification from [facility] that Prior Authorization for Zerviate 0.24% solution  has been APPROVED from 03/14/2023 to 0922/2025. Ran test claim, Copay is $170.00. This test claim was processed through [facility]- copay amounts may vary at other pharmacies due to pharmacy/plan contracts, or as the patient moves through the different stages of their insurance plan.   PA #/Case ID/Reference #: [id number]

## 2023-03-15 ENCOUNTER — Other Ambulatory Visit: Payer: Self-pay

## 2023-03-15 MED ORDER — MONTELUKAST SODIUM 10 MG PO TABS: 10.0000 mg | ORAL_TABLET | Freq: Every day | ORAL | 1 refills | Status: DC

## 2023-03-15 MED ORDER — ZERVIATE 0.24 % OP SOLN: OPHTHALMIC | 5 refills | Status: DC

## 2023-03-15 NOTE — Addendum Note (Signed)
Addended by: Areta Haber B on: 03/15/2023 12:29 PM   Modules accepted: Orders

## 2023-03-15 NOTE — Telephone Encounter (Signed)
That is fine - I bet she will still pay. Can you can and let the patient know?   Does anyone know who the rep is?   Malachi Bonds, MD [facility]

## 2023-03-15 NOTE — Telephone Encounter (Addendum)
Called patient - DOB/Pharmacy verified - advised of provider and PA notation below.   Patient stated to go ahead and send Rx to [facility] - fyi: [employer] did advise her that presently they are out of stock of eye drop.  Patient stated she has a coupon as well that she will use with [facility].  Patient stated she still will need alternative eye drop until she can obtain Zerviate.  Patient advised message would be forwarded to provider for alternatives.  Patient verbalized understanding to all, no further questions.

## 2023-03-18 NOTE — Telephone Encounter (Signed)
I am not sure the good substitute at this point.  With her allergy to the preservatives and all of the eyedrops, I do not think she can tolerate any of them.  She may just have to wait until they are available again.  I wonder if she could get them from a [place] pharmacy?  Is a Singulair helping her at all?  Malachi Bonds, MD [facility]

## 2023-03-18 NOTE — Telephone Encounter (Signed)
I did call the patient and informed her of your recommendation. She did state that the Montelukast was helping.

## 2023-04-07 ENCOUNTER — Ambulatory Visit: Payer: BC Managed Care – PPO | Admitting: Orthopedic Surgery

## 2023-04-13 ENCOUNTER — Encounter: Payer: Self-pay | Admitting: Orthopedic Surgery

## 2023-04-13 ENCOUNTER — Ambulatory Visit: Payer: BC Managed Care – PPO | Admitting: Orthopedic Surgery

## 2023-04-13 DIAGNOSIS — M7660 Achilles tendinitis, unspecified leg: Secondary | ICD-10-CM | POA: Diagnosis not present

## 2023-04-13 NOTE — Progress Notes (Signed)
Office Visit Note   Patient: [name]           Date of Birth: 12-Dec-1970           MRN: [id number] Visit Date: 04/13/2023 Requested by: Marcelle Overlie, MD [address] [place],  [place] [address] PCP: Marcelle Overlie, MD  Subjective: Chief Complaint  Patient presents with   Other     Left achilles pain x 6 months    HPI: [name] is a 52 y.o. female who presents to the office reporting left Achilles pain for 6 months duration as well as left MTP pain for about the same amount of time.  He states the pain is periodic and unpredictable.  She likes doing trail runs about 3 to 4 miles a day on most days.  Does report some pain with walking.  Describes focal swelling in the Achilles region.  She has not done any running for the past week and it is some better but that is the maximum amount of time she is taken off.  Pain does not wake her from sleep at night.  She reports some tightness.  Worse in the mornings.  Does report stiffness in the ankle in the morning.  Has tried Epsom salt treatment as well as wraps.  Also has been doing stretching.  Also tried a [place] gun.  She uses new shoes about every 3 months.  Runs about 250 miles every 3 months.  Also describes pain in the left MTP joint of the great toe which is particularly noticeable with planks.  Patient is also interested in physical therapy assessment for body composition evaluation..                ROS: All systems reviewed are negative as they relate to the chief complaint within the history of present illness.  Patient denies fevers or chills.  Assessment & Plan: Visit Diagnoses:  1. 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  She also has left great toe MTP arthritis based on exam from diminished range of motion passively in that left toe compared to the right.  This is a difficult problem.  Her Achilles tendon is not  excessively tight.  I think she runs a risk of rupture based on where this nodularity is.  Does not appear to be.  Tendinosis but instead intratendinous degeneration involving that 10% of that tendon volume cross-sectional area.  This is based on ultrasound evaluation.  Plan is consideration of ultrasound shockwave treatment for symptomatic relief as well as significant levels of activity modification to allow this to heal.  I think a bigger problem for her long-term is going to be that MTP arthritis.  She has lost about 40% of her passive range of motion on the left hip compared to the right.  Follow-up with [place] as needed.  Refer to Dr. Shon Baton for ultrasound evaluation and treatment.  Follow-Up Instructions: No follow-ups on file.   Orders:  Orders Placed This Encounter  Procedures   Ambulatory referral to Physical Therapy   Ambulatory referral to Orthopedic Surgery   No orders of the defined types were placed in this encounter.     Procedures: No procedures performed   Clinical Data: No additional findings.  Objective: Vital Signs: There were no vitals taken for this visit.  Physical Exam:  Constitutional: Patient appears well-developed HEENT:  Head: Normocephalic Eyes:EOM  are normal Neck: Normal range of motion Cardiovascular: Normal rate Pulmonary/chest: Effort normal Neurologic: Patient is alert Skin: Skin is warm Psychiatric: Patient has normal mood and affect  Ortho Exam: Ortho exam demonstrates about 40% loss of motion left MTP joint compared to right.  Mild pain in this region with some swelling present as well.  No discrete asymmetric dorsal osteophytes palpable.  Left Achilles region does have a focal area of nodularity in the watershed area measuring about 1 x 1 cm.  Has good plantarflexion strength and normal arches.  Has very good eversion and inversion strength as well as ankle dorsiflexion strength on that left-hand side with palpable pedal pulses and intact  sensation.  Ankle dorsiflexion is about 15 to 20 degrees past neutral in both ankles.  Specialty Comments:  No specialty comments available.  Imaging: No results found.   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/08/2023   Fracture, Colles, left, closed    Umbilical hernia, reducible 01/05/2012   Rectus diastasis 01/05/2012   Past Medical History:  Diagnosis Date   Asthma    Eczema    PONV (postoperative nausea and vomiting)    Umbilical hernia     Family History  Problem Relation Age of Onset   Cancer Mother        melanoma    Past Surgical History:  Procedure Laterality Date   ABDOMINOPLASTY     acdf     CESAREAN SECTION  2008, 2010   EYE SURGERY     catarct   MICRODISCECTOMY LUMBAR     ORIF WRIST FRACTURE Left 09/09/2020   Procedure: OPEN REDUCTION INTERNAL FIXATION (ORIF) LEFT DISTAL RADIUS ORIF;  Surgeon: Cammy Copa, MD;  Location: [facility] OR;  Service: Orthopedics;  Laterality: Left;   TUBAL LIGATION  2010   Social History   Occupational History   Not on file  Tobacco Use   Smoking status: Never   Smokeless tobacco: Never  Vaping Use   Vaping status: Never Used  Substance and Sexual Activity   Alcohol use: No   Drug use: No   Sexual activity: Not on file

## 2023-04-14 ENCOUNTER — Encounter: Payer: Self-pay | Admitting: Physical Therapy

## 2023-04-14 ENCOUNTER — Other Ambulatory Visit: Payer: Self-pay

## 2023-04-14 ENCOUNTER — Ambulatory Visit: Payer: BC Managed Care – PPO | Admitting: Physical Therapy

## 2023-04-14 DIAGNOSIS — M25572 Pain in left ankle and joints of left foot: Secondary | ICD-10-CM

## 2023-04-14 DIAGNOSIS — R29898 Other symptoms and signs involving the musculoskeletal system: Secondary | ICD-10-CM

## 2023-04-14 NOTE — Therapy (Signed)
OUTPATIENT PHYSICAL THERAPY EVALUATION   Patient Name: [name] MRN: [id number] DOB:Jun 28, 1970, 52 y.o., female Today's Date: 04/14/2023  END OF SESSION:  PT End of Session - 04/14/23 1117     Visit Number 1    Number of Visits 6    Date for PT Re-Evaluation 05/26/23    Authorization Type [employer] $55 copay, 40 PT visits    PT Start Time 1020    PT Stop Time 1103    PT Time Calculation (min) 43 min    Activity Tolerance Patient tolerated treatment well    Behavior During Therapy WFL for tasks assessed/performed             Past Medical History:  Diagnosis Date   Asthma    Eczema    PONV (postoperative nausea and vomiting)    Umbilical hernia    Past Surgical History:  Procedure Laterality Date   ABDOMINOPLASTY     acdf     CESAREAN SECTION  2008, 2010   EYE SURGERY     catarct   MICRODISCECTOMY LUMBAR     ORIF WRIST FRACTURE Left 09/09/2020   Procedure: OPEN REDUCTION INTERNAL FIXATION (ORIF) LEFT DISTAL RADIUS ORIF;  Surgeon: Cammy Copa, MD;  Location: [facility] OR;  Service: Orthopedics;  Laterality: Left;   TUBAL LIGATION  2010   Patient Active Problem List   Diagnosis Date Noted   Childhood asthma 03/08/2023   Seasonal and perennial allergic rhinitis 03/08/2023   Contact dermatitis due to chemicals 03/08/2023   Fracture, Colles, left, closed    Umbilical hernia, reducible 01/05/2012   Rectus diastasis 01/05/2012    PCP: Marcelle Overlie, MD  REFERRING PROVIDER: Cammy Copa, MD  REFERRING DIAG: M76.60 (ICD-10-CM) - Pain in Achilles tendon   Rationale for Evaluation and Treatment: Rehabilitation  THERAPY DIAG:  Pain in left ankle and joints of left foot - Plan: PT plan of care cert/re-cert  Other symptoms and signs involving the musculoskeletal system - Plan: PT plan of care cert/re-cert  ONSET DATE: 09/2022   SUBJECTIVE:                                                                                                                                                                                            SUBJECTIVE STATEMENT: Pt reports 6 month hx of Lt achilles pain.  She does trail running 3-4 miles daily; but walking and running both caused a burning pain.  Pain is worse in the morning or in prolonged plantarflexed position.  Heat has helped.    PERTINENT HISTORY:  Asthma, L5/S1 microdiscectomy 2007; C5/6 ACDF  PAIN:  Are  you having pain? Yes: NPRS scale: 1 currently, up to 6-7/10 Pain location: Lt achilles Pain description: burning Aggravating factors: worse after prolonged plantarflexion, maybe running uphill Relieving factors: heat  PRECAUTIONS:  None  RED FLAGS: None   WEIGHT BEARING RESTRICTIONS:  No  FALLS:  Has patient fallen in last 6 months? 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  PLOF:  Independent and Leisure: does 30 min HIIT with Tonal  PATIENT GOALS:  Run without pain, unrestricted strength training   OBJECTIVE:   DIAGNOSTIC FINDINGS:  N/A  PATIENT SURVEYS:  04/14/23 FOTO 57 (predicted 74)  COGNITIVE STATUS: Within functional limits for tasks assessed   SENSATION: WFL   GAIT: 04/14/23 Comments: independent with amb; no significant deviations noted at this time  PALPATION: 04/14/23 trigger points noted in gastroc/soleus; tender to palpation along achilles tendon   LOWER EXTREMITY ROM:     ROM Left eval  Ankle dorsiflexion A: 7  Ankle plantarflexion A: 70  Ankle inversion A: 18  Ankle eversion A: 14   (Blank rows = not tested)   LOWER EXTREMITY MMT:    MMT Left eval  Ankle dorsiflexion 5/5  Ankle plantarflexion 4/5 with  pain  Ankle inversion 5/5  Ankle eversion 4/5   (Blank rows = not tested)    TREATMENT:                                                                                                                              DATE:  04/14/23 TherEx See HEP -  demonstrated for pt with min cues; pt verbalized understanding  Discussed activity modification to keep ankle in neutral as able with strength training to decrease strain on ankle - pt verbalized understanding  Manual STM with compression to Lt gastroc/soleus; skilled palpation and monitoring of soft tissue during DN Trigger Point Dry-Needling  Treatment instructions: Expect mild to moderate muscle soreness. S/S of pneumothorax if dry needled over a lung field, and to seek immediate medical attention should they occur. Patient verbalized understanding of these instructions and education.  Patient Consent Given: Yes Education handout provided: Yes Muscles treated: Lt soleus Electrical stimulation performed: No Parameters: N/A Treatment response/outcome: twitch responses noted    PATIENT EDUCATION:  Education details: HEP, DN, use of heel lift with walking PRN Person educated: Patient Education method: Explanation, Demonstration, and Handouts Education comprehension: verbalized understanding, returned demonstration, and needs further education  HOME EXERCISE PROGRAM: Access Code: [email or web address] URL: [email or web address] Date: 04/14/2023 Prepared by: Moshe Cipro  Exercises - Gastroc Stretch on Wall  - 2 x daily - 7 x weekly - 1 sets - 3 reps - 30 sec hold - Soleus Stretch on Wall  - 2 x daily - 7 x weekly - 1 sets - 3 reps - 30 sec hold - Long Sitting Ankle Eversion with Resistance  - 2 x daily - 7 x weekly -  2 sets - 10 reps  Patient Education - Trigger Point Dry Needling   ASSESSMENT:  CLINICAL IMPRESSION: Patient is a 52 y.o. female who was seen today for physical therapy evaluation and treatment for Lt achilles pain. She demonstrates decreased strength and continued pain with trigger points affecting functional mobility.  She will benefit from PT to address deficits listed.   OBJECTIVE IMPAIRMENTS: Abnormal gait, decreased strength, increased fascial  restrictions, increased muscle spasms, and pain.   ACTIVITY LIMITATIONS: standing and locomotion level  PARTICIPATION LIMITATIONS: community activity, occupation, and strength training, running  PERSONAL FACTORS: 1-2 comorbidities: ACDF, L5/S1 microdiscectomy, allergies  are also affecting patient's functional outcome.   REHAB POTENTIAL: Good  CLINICAL DECISION MAKING: Stable/uncomplicated  EVALUATION COMPLEXITY: Low   GOALS: Goals reviewed with patient? Yes  SHORT TERM GOALS: Target date: 05/05/2023  Independent with initial HEP Goal status: INITIAL  LONG TERM GOALS: Target date: 05/26/2023  Independent with final HEP Goal status: INITIAL  2.  FOTO score improved to 74 Goal status: INITIAL  3.  Lt ankle strength improved to 5/5 for improved function and mobility Goal status: INIITAL  4.  Report pain < 2/10 with running/walking/strength training for improved function Goal status: INITIAL   PLAN:  PT FREQUENCY: 1x/week  PT DURATION: 6 weeks  PLANNED INTERVENTIONS: 97164- PT Re-evaluation, 97110-Therapeutic exercises, 97530- Therapeutic activity, 97112- Neuromuscular re-education, 97535- Self Care, 96045- Manual therapy, [id number]- Gait training, [phone]- Orthotic Fit/training, [phone]- Aquatic Therapy, 97014- Electrical stimulation (unattended), 97016- Vasopneumatic device, [id number]- Ionotophoresis 4mg /ml Dexamethasone, Patient/Family education, Balance training, Taping, Dry Needling, Joint mobilization, Cryotherapy, and Moist heat.  PLAN FOR NEXT SESSION: assess response to DN, review HEP PRN, repeat DN PRN, how was appt with Dr. Laural Golden MD VISIT: 04/19/23 (Dr. Shon Baton)   Clarita Crane, PT, DPT 04/14/23 11:35 AM

## 2023-04-19 ENCOUNTER — Ambulatory Visit: Payer: BC Managed Care – PPO | Admitting: Sports Medicine

## 2023-04-19 ENCOUNTER — Encounter: Payer: Self-pay | Admitting: Sports Medicine

## 2023-04-19 DIAGNOSIS — M7660 Achilles tendinitis, unspecified leg: Secondary | ICD-10-CM

## 2023-04-19 NOTE — Progress Notes (Signed)
[name] - 52 y.o. female MRN [id number]  Date of birth: [birth year]  Office Visit Note: Visit Date: 04/19/2023 PCP: Marcelle Overlie, MD Referred by: Cammy Copa, MD  Subjective: Chief Complaint  Patient presents with   Left Ankle - Pain    Achilles   HPI: [name] is a pleasant 52 y.o. female who presents today for acute on chronic left achilles pain.  She has had left Achilles pain on and off for about 6 months.  She is a very active and fit individual, does a lot of trail runs, running and hiking.  Denies any inciting event or specific injury.  She did note some nodularity in the mid aspect of the Achilles.  She has tried Epsom salt treatments as well as ibuprofen as needed without much relief.  She did have her first session of formalized physical therapy with some dry needling which was helpful.  Pertinent ROS were reviewed with the patient and found to be negative unless otherwise specified above in HPI.   Assessment & Plan: Visit Diagnoses:  1. Non-insertional Achilles tendinopathy   2. Pain in Achilles tendon    Plan: Impression is Achilles tendinopathy with some fusiform thickening.  Through shared decision-making, did proceed with extracorporeal shockwave treatment therapy today.  I did discuss the role of adding nitroglycerin patch protocol, handout was provided she will read about this and decide if she wants to move forward with this in conjunction.  She will continue her formalized physical therapy and Achilles stretching/strengthening.  Did place 5/16 inch heel lift to help offload the Achilles as well.  She will follow-up next week for repeat ESWT and reevaluation.  Follow-up: Return in about 1 week (around 04/26/2023) for L-achilles.   Meds & Orders: No orders of the defined types were placed in this encounter.  No orders of the defined types were placed in this encounter.    Procedures: Procedure: ECSWT Indications:  Left achilles  tendinopathy   Procedure Details Consent: Risks of procedure as well as the alternatives and risks of each were explained to the patient.  Verbal consent for procedure obtained. Time Out: Verified patient identification, verified procedure, site was marked, verified correct patient position. The area was cleaned with alcohol swab.     The left achilles was targeted for Extracorporeal shockwave therapy.    Preset: Achillodynia Power Level: 100 - 110 mJ Frequency: 10-12 Hz Impulse/cycles: 3000 Head size: Regular   Patient tolerated procedure well without immediate complications.         Clinical History: No specialty comments available.  She reports that she has never smoked. She has never used smokeless tobacco. No results for input(s): "HGBA1C", "LABURIC" in the last 8760 hours.  Objective:    Physical Exam  Gen: Well-appearing, in no acute distress; non-toxic CV: Well-perfused. Warm.  Resp: Breathing unlabored on room air; no wheezing. Psych: Fluid speech in conversation; appropriate affect; normal thought process Neuro: Sensation intact throughout. No gross coordination deficits.   Ortho Exam - LLE: + TTP with a small nodule over the mid aspect of the Achilles near the watershed area.  There is about 5 degrees less of dorsiflexion of this ankle compared to the contralateral side.  Redness swelling about the ankle joint.  Imaging:  -POCUS ultrasound shows some fusiform thickening in the mid substance of the Achilles superficially.  There is no significant intrasubstance tearing.  Past Medical/Family/Surgical/Social History: Medications & Allergies reviewed per EMR, new medications updated. Patient Active Problem  List   Diagnosis Date Noted   Childhood asthma 03/08/2023   Seasonal and perennial allergic rhinitis 03/08/2023   Contact dermatitis due to chemicals 03/08/2023   Fracture, Colles, left, closed    Umbilical hernia, reducible 01/05/2012   Rectus diastasis  01/05/2012   Past Medical History:  Diagnosis Date   Asthma    Eczema    PONV (postoperative nausea and vomiting)    Umbilical hernia    Family History  Problem Relation Age of Onset   Cancer Mother        melanoma   Past Surgical History:  Procedure Laterality Date   ABDOMINOPLASTY     acdf     CESAREAN SECTION  2008, 2010   EYE SURGERY     catarct   MICRODISCECTOMY LUMBAR     ORIF WRIST FRACTURE Left 09/09/2020   Procedure: OPEN REDUCTION INTERNAL FIXATION (ORIF) LEFT DISTAL RADIUS ORIF;  Surgeon: Cammy Copa, MD;  Location: [facility] OR;  Service: Orthopedics;  Laterality: Left;   TUBAL LIGATION  2010   Social History   Occupational History   Not on file  Tobacco Use   Smoking status: Never   Smokeless tobacco: Never  Vaping Use   Vaping status: Never Used  Substance and Sexual Activity   Alcohol use: No   Drug use: No   Sexual activity: Not on file

## 2023-04-19 NOTE — Patient Instructions (Signed)

## 2023-04-20 ENCOUNTER — Ambulatory Visit: Payer: BC Managed Care – PPO | Admitting: Physical Therapy

## 2023-04-20 ENCOUNTER — Encounter: Payer: Self-pay | Admitting: Physical Therapy

## 2023-04-20 DIAGNOSIS — R29898 Other symptoms and signs involving the musculoskeletal system: Secondary | ICD-10-CM | POA: Diagnosis not present

## 2023-04-20 DIAGNOSIS — M25572 Pain in left ankle and joints of left foot: Secondary | ICD-10-CM | POA: Diagnosis not present

## 2023-04-20 NOTE — Therapy (Signed)
OUTPATIENT PHYSICAL THERAPY TREATMENT   Patient Name: [name] MRN: [id number] DOB:10-Jul-1970, 52 y.o., female Today's Date: 04/20/2023  END OF SESSION:  PT End of Session - 04/20/23 0933     Visit Number 2    Number of Visits 6    Date for PT Re-Evaluation 05/26/23    Authorization Type [employer] $55 copay, 40 PT visits    PT Start Time 0930    PT Stop Time 1002    PT Time Calculation (min) 32 min    Activity Tolerance Patient tolerated treatment well    Behavior During Therapy WFL for tasks assessed/performed              Past Medical History:  Diagnosis Date   Asthma    Eczema    PONV (postoperative nausea and vomiting)    Umbilical hernia    Past Surgical History:  Procedure Laterality Date   ABDOMINOPLASTY     acdf     CESAREAN SECTION  2008, 2010   EYE SURGERY     catarct   MICRODISCECTOMY LUMBAR     ORIF WRIST FRACTURE Left 09/09/2020   Procedure: OPEN REDUCTION INTERNAL FIXATION (ORIF) LEFT DISTAL RADIUS ORIF;  Surgeon: Cammy Copa, MD;  Location: [facility] OR;  Service: Orthopedics;  Laterality: Left;   TUBAL LIGATION  2010   Patient Active Problem List   Diagnosis Date Noted   Childhood asthma 03/08/2023   Seasonal and perennial allergic rhinitis 03/08/2023   Contact dermatitis due to chemicals 03/08/2023   Fracture, Colles, left, closed    Umbilical hernia, reducible 01/05/2012   Rectus diastasis 01/05/2012    PCP: Marcelle Overlie, MD  REFERRING PROVIDER: Cammy Copa, MD  REFERRING DIAG: M76.60 (ICD-10-CM) - Pain in Achilles tendon   Rationale for Evaluation and Treatment: Rehabilitation  THERAPY DIAG:  Pain in left ankle and joints of left foot  Other symptoms and signs involving the musculoskeletal system  ONSET DATE: 09/2022   SUBJECTIVE:                                                                                                                                                                                            SUBJECTIVE STATEMENT: Feels like she's having more discomfort; felt like she had better motion after DN.    PERTINENT HISTORY:  Asthma, L5/S1 microdiscectomy 2007; C5/6 ACDF  PAIN:  Are you having pain? Yes: NPRS scale: 1 currently, up to 6-7/10 Pain location: Lt achilles Pain description: burning Aggravating factors: worse after prolonged plantarflexion, maybe running uphill Relieving factors: heat  PRECAUTIONS:  None  RED FLAGS: None   WEIGHT  BEARING RESTRICTIONS:  No  FALLS:  Has patient fallen in last 6 months? 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  PLOF:  Independent and Leisure: does 30 min HIIT with Tonal  PATIENT GOALS:  Run without pain, unrestricted strength training   OBJECTIVE:   DIAGNOSTIC FINDINGS:  N/A  PATIENT SURVEYS:  04/14/23 FOTO 57 (predicted 74)  COGNITIVE STATUS: Within functional limits for tasks assessed   SENSATION: WFL   GAIT: 04/14/23 Comments: independent with amb; no significant deviations noted at this time  PALPATION: 04/14/23 trigger points noted in gastroc/soleus; tender to palpation along achilles tendon   LOWER EXTREMITY ROM:     ROM Left eval Left 04/20/23  Ankle dorsiflexion A: 7 A: 8  Ankle plantarflexion A: 70 A: 70  Ankle inversion A: 18   Ankle eversion A: 14    (Blank rows = not tested)   LOWER EXTREMITY MMT:    MMT Left eval  Ankle dorsiflexion 5/5  Ankle plantarflexion 4/5 with  pain  Ankle inversion 5/5  Ankle eversion 4/5   (Blank rows = not tested)    TREATMENT:                                                                                                                              DATE:  04/20/23 Manual STM with compression to bil gastroc/soleus; skilled palpation and monitoring of soft tissue during DN Trigger Point Dry-Needling  Treatment instructions: Expect mild to moderate muscle soreness.  S/S of pneumothorax if dry needled over a lung field, and to seek immediate medical attention should they occur. Patient verbalized understanding of these instructions and education.  Patient Consent Given: Yes Education handout provided: Yes Muscles treated: bil gastroc/soleus Electrical stimulation performed: Yes Parameters: 8mA freq with intensity to tolerance (~2) x 3 min bil gastroc/soleus Treatment response/outcome: twitch responses noted  TherEx ROM measurements - see above Recommended slight heel lift with squats, and how to unload LLE with household activities if pain begins; pt verbalized understanding   04/14/23 TherEx See HEP - demonstrated for pt with min cues; pt verbalized understanding  Discussed activity modification to keep ankle in neutral as able with strength training to decrease strain on ankle - pt verbalized understanding  Manual STM with compression to Lt gastroc/soleus; skilled palpation and monitoring of soft tissue during DN Trigger Point Dry-Needling  Treatment instructions: Expect mild to moderate muscle soreness. S/S of pneumothorax if dry needled over a lung field, and to seek immediate medical attention should they occur. Patient verbalized understanding of these instructions and education.  Patient Consent Given: Yes Education handout provided: Yes Muscles treated: Lt soleus Electrical stimulation performed: No Parameters: N/A Treatment response/outcome: twitch responses noted    PATIENT EDUCATION:  Education details: HEP, DN, use of heel lift with walking PRN Person educated: Patient Education method: Explanation, Demonstration, and Handouts Education comprehension: verbalized understanding, returned  demonstration, and needs further education  HOME EXERCISE PROGRAM: Access Code: [email or web address] URL: [email or web address] Date: 04/14/2023 Prepared by: Moshe Cipro  Exercises - Gastroc Stretch on Wall  - 2 x daily - 7 x  weekly - 1 sets - 3 reps - 30 sec hold - Soleus Stretch on Wall  - 2 x daily - 7 x weekly - 1 sets - 3 reps - 30 sec hold - Long Sitting Ankle Eversion with Resistance  - 2 x daily - 7 x weekly - 2 sets - 10 reps  Patient Education - Trigger Point Dry Needling   ASSESSMENT:  CLINICAL IMPRESSION: Pt tolerated session well today reporting decreased tightness following DN and manual therapy.  Trial of estim with DN today to see if this is helpful.  Will continue to benefit from PT to maximize function.   OBJECTIVE IMPAIRMENTS: Abnormal gait, decreased strength, increased fascial restrictions, increased muscle spasms, and pain.   ACTIVITY LIMITATIONS: standing and locomotion level  PARTICIPATION LIMITATIONS: community activity, occupation, and strength training, running  PERSONAL FACTORS: 1-2 comorbidities: ACDF, L5/S1 microdiscectomy, allergies  are also affecting patient's functional outcome.   REHAB POTENTIAL: Good  CLINICAL DECISION MAKING: Stable/uncomplicated  EVALUATION COMPLEXITY: Low   GOALS: Goals reviewed with patient? Yes  SHORT TERM GOALS: Target date: 05/05/2023  Independent with initial HEP Goal status: INITIAL  LONG TERM GOALS: Target date: 05/26/2023  Independent with final HEP Goal status: INITIAL  2.  FOTO score improved to 74 Goal status: INITIAL  3.  Lt ankle strength improved to 5/5 for improved function and mobility Goal status: INIITAL  4.  Report pain < 2/10 with running/walking/strength training for improved function Goal status: INITIAL   PLAN:  PT FREQUENCY: 1x/week  PT DURATION: 6 weeks  PLANNED INTERVENTIONS: 97164- PT Re-evaluation, 97110-Therapeutic exercises, 97530- Therapeutic activity, 97112- Neuromuscular re-education, 97535- Self Care, 95188- Manual therapy, [id number]- Gait training, [phone]- Orthotic Fit/training, [phone]- Aquatic Therapy, 97014- Electrical stimulation (unattended), 97016- Vasopneumatic device, [id number]- Ionotophoresis  4mg /ml Dexamethasone, Patient/Family education, Balance training, Taping, Dry Needling, Joint mobilization, Cryotherapy, and Moist heat.  PLAN FOR NEXT SESSION: assess response to DN, repeat DN PRN   NEXT MD VISIT: 04/26/23 (Dr. Shon Baton)   Clarita Crane, PT, DPT 04/20/23 10:06 AM

## 2023-04-26 ENCOUNTER — Ambulatory Visit: Payer: BC Managed Care – PPO | Admitting: Sports Medicine

## 2023-04-26 ENCOUNTER — Telehealth: Payer: Self-pay | Admitting: Sports Medicine

## 2023-04-26 DIAGNOSIS — M7660 Achilles tendinitis, unspecified leg: Secondary | ICD-10-CM | POA: Diagnosis not present

## 2023-04-26 MED ORDER — NITROGLYCERIN 0.2 MG/HR TD PT24: MEDICATED_PATCH | TRANSDERMAL | 1 refills | Status: DC

## 2023-04-26 NOTE — Telephone Encounter (Signed)
[facility] and confirmed that they have the information that they need for this prescription.

## 2023-04-26 NOTE — Telephone Encounter (Signed)
[facility] called and said they received the patch but they needed to know how often to use it and how long with the prescription will last. CB#[phone]

## 2023-04-26 NOTE — Progress Notes (Signed)
Patient says that overall her achilles is feeling better, but still has a dull pain that is more so nagging. She says that strength training and [employer] don't seem to bother it, but that low intensity walking does contribute to soreness and pain after.

## 2023-04-26 NOTE — Progress Notes (Signed)
[name] - 52 y.o. female MRN [id number]  Date of birth: 1970-08-01  Office Visit Note: Visit Date: 04/26/2023 PCP: Marcelle Overlie, MD Referred by: Marcelle Overlie, MD  Subjective: Chief Complaint  Patient presents with   Left Ankle - Follow-up, Pain   HPI: [name] is a pleasant 52 y.o. female who presents today for follow-up of chronic left Achilles tendinopathy and Achilles pain.  [name] does state with our first treatment of extracorporeal shockwave therapy and her physical therapy with soft tissue treatments she does feel somewhat better.  Has no issues with strength training and [employer], still some soreness after walking.  Pertinent ROS were reviewed with the patient and found to be negative unless otherwise specified above in HPI.   Assessment & Plan: Visit Diagnoses:  1. Non-insertional Achilles tendinopathy   2. Pain in Achilles tendon    Plan: We did repeat extracorporeal shockwave therapy for the Achilles today, patient tolerated well.  She is making some progress with this and her continued formalized physical therapy.  To help with blood flow augmentation, we will add a nitroglycerin patch, protocol discussed today.  She will changes every 24 hours.  She will continue in her 5/16 inch heel lift to help offload the Achilles.  Once she returns from her trip, we will see her back for repeat use ESWT and reevaluation.  Depending on improvement we will consider additional treatments going forward.  Advised to continue her crosstraining but would hold on regular prolonged walking until further improved.  Follow-up: Return for f/u for L-achilles in 1-2 weeks.   Meds & Orders:  Meds ordered this encounter  Medications   nitroGLYCERIN (NITRODUR - DOSED IN MG/24 HR) 0.2 mg/hr patch    Sig: Cut patch into fourths. Place 1/4 patch over affected area of achilles.    Dispense:  30 patch    Refill:  1   No orders of the defined types were placed in this  encounter.    Procedures: Procedure: ECSWT Indications:  Left achilles tendinopathy   Procedure Details Consent: Risks of procedure as well as the alternatives and risks of each were explained to the patient.  Verbal consent for procedure obtained. Time Out: Verified patient identification, verified procedure, site was marked, verified correct patient position. The area was cleaned with alcohol swab.     The left achilles was targeted for Extracorporeal shockwave therapy.    Preset: Achillodynia Power Level: 100 Frequency:12 Hz Impulse/cycles: 3400 Head size: Regular   Patient tolerated procedure well without immediate complications.      Clinical History: No specialty comments available.  She reports that she has never smoked. She has never used smokeless tobacco. No results for input(s): "HGBA1C", "LABURIC" in the last 8760 hours.  Objective:    Physical Exam  Gen: Well-appearing, in no acute distress; non-toxic CV: Well-perfused. Warm.  Resp: Breathing unlabored on room air; no wheezing. Psych: Fluid speech in conversation; appropriate affect; normal thought process Neuro: Sensation intact throughout. No gross coordination deficits.   Ortho Exam - LLE: There is a very small nodule over the mid aspect of the Achilles, although less prominent than last visit.  There is improved dorsiflexion.  Imaging: No results found.  Past Medical/Family/Surgical/Social History: Medications & Allergies reviewed per EMR, new medications updated. Patient Active Problem List   Diagnosis Date Noted   Childhood asthma 03/08/2023   Seasonal and perennial allergic rhinitis 03/08/2023   Contact dermatitis due to chemicals 03/08/2023   Fracture, Colles, left,  closed    Umbilical hernia, reducible 01/05/2012   Rectus diastasis 01/05/2012   Past Medical History:  Diagnosis Date   Asthma    Eczema    PONV (postoperative nausea and vomiting)    Umbilical hernia    Family History   Problem Relation Age of Onset   Cancer Mother        melanoma   Past Surgical History:  Procedure Laterality Date   ABDOMINOPLASTY     acdf     CESAREAN SECTION  2008, 2010   EYE SURGERY     catarct   MICRODISCECTOMY LUMBAR     ORIF WRIST FRACTURE Left 09/09/2020   Procedure: OPEN REDUCTION INTERNAL FIXATION (ORIF) LEFT DISTAL RADIUS ORIF;  Surgeon: Cammy Copa, MD;  Location: [facility] OR;  Service: Orthopedics;  Laterality: Left;   TUBAL LIGATION  2010   Social History   Occupational History   Not on file  Tobacco Use   Smoking status: Never   Smokeless tobacco: Never  Vaping Use   Vaping status: Never Used  Substance and Sexual Activity   Alcohol use: No   Drug use: No   Sexual activity: Not on file

## 2023-04-26 NOTE — Addendum Note (Signed)
Addended by: Jamie Kato III on: 04/26/2023 12:55 PM   Modules accepted: Orders

## 2023-04-26 NOTE — Patient Instructions (Signed)

## 2023-04-28 ENCOUNTER — Encounter: Payer: Self-pay | Admitting: Physical Therapy

## 2023-04-28 ENCOUNTER — Ambulatory Visit: Payer: BC Managed Care – PPO | Admitting: Physical Therapy

## 2023-04-28 DIAGNOSIS — M25572 Pain in left ankle and joints of left foot: Secondary | ICD-10-CM

## 2023-04-28 DIAGNOSIS — R29898 Other symptoms and signs involving the musculoskeletal system: Secondary | ICD-10-CM

## 2023-04-28 NOTE — Therapy (Signed)
OUTPATIENT PHYSICAL THERAPY TREATMENT   Patient Name: [name] MRN: [id number] DOB:01-24-1971, 52 y.o., female Today's Date: 04/28/2023  END OF SESSION:  PT End of Session - 04/28/23 1417     Visit Number 3    Number of Visits 6    Date for PT Re-Evaluation 05/26/23    Authorization Type [employer] $55 copay, 40 PT visits    PT Start Time 1416    PT Stop Time 1445    PT Time Calculation (min) 29 min    Activity Tolerance Patient tolerated treatment well    Behavior During Therapy WFL for tasks assessed/performed               Past Medical History:  Diagnosis Date   Asthma    Eczema    PONV (postoperative nausea and vomiting)    Umbilical hernia    Past Surgical History:  Procedure Laterality Date   ABDOMINOPLASTY     acdf     CESAREAN SECTION  2008, 2010   EYE SURGERY     catarct   MICRODISCECTOMY LUMBAR     ORIF WRIST FRACTURE Left 09/09/2020   Procedure: OPEN REDUCTION INTERNAL FIXATION (ORIF) LEFT DISTAL RADIUS ORIF;  Surgeon: Cammy Copa, MD;  Location: [facility] OR;  Service: Orthopedics;  Laterality: Left;   TUBAL LIGATION  2010   Patient Active Problem List   Diagnosis Date Noted   Childhood asthma 03/08/2023   Seasonal and perennial allergic rhinitis 03/08/2023   Contact dermatitis due to chemicals 03/08/2023   Fracture, Colles, left, closed    Umbilical hernia, reducible 01/05/2012   Rectus diastasis 01/05/2012    PCP: Marcelle Overlie, MD  REFERRING PROVIDER: Cammy Copa, MD  REFERRING DIAG: M76.60 (ICD-10-CM) - Pain in Achilles tendon   Rationale for Evaluation and Treatment: Rehabilitation  THERAPY DIAG:  Pain in left ankle and joints of left foot  Other symptoms and signs involving the musculoskeletal system  ONSET DATE: 09/2022   SUBJECTIVE:                                                                                                                                                                                            SUBJECTIVE STATEMENT: Was doing really well for awhile; walked 0.7 miles without difficulty, then 1.28 miles aggravated it.  Pain is still better overall. Pain in the morning is much better as well.  Started nitroglycerin protocol.    PERTINENT HISTORY:  Asthma, L5/S1 microdiscectomy 2007; C5/6 ACDF  PAIN:  Are you having pain? Yes: NPRS scale: 0 currently, up to 2-3/10 Pain location: Lt achilles Pain description: burning Aggravating  factors: worse after prolonged plantarflexion, maybe running uphill Relieving factors: heat  PRECAUTIONS:  None  RED FLAGS: None   WEIGHT BEARING RESTRICTIONS:  No  FALLS:  Has patient fallen in last 6 months? 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  PLOF:  Independent and Leisure: does 30 min HIIT with Tonal  PATIENT GOALS:  Run without pain, unrestricted strength training   OBJECTIVE:   DIAGNOSTIC FINDINGS:  N/A  PATIENT SURVEYS:  04/14/23 FOTO 57 (predicted 74)  COGNITIVE STATUS: Within functional limits for tasks assessed   SENSATION: WFL   GAIT: 04/14/23 Comments: independent with amb; no significant deviations noted at this time  PALPATION: 04/14/23 trigger points noted in gastroc/soleus; tender to palpation along achilles tendon   LOWER EXTREMITY ROM:     ROM Left eval Left 04/20/23  Ankle dorsiflexion A: 7 A: 8  Ankle plantarflexion A: 70 A: 70  Ankle inversion A: 18   Ankle eversion A: 14    (Blank rows = not tested)   LOWER EXTREMITY MMT:    MMT Left eval  Ankle dorsiflexion 5/5  Ankle plantarflexion 4/5 with  pain  Ankle inversion 5/5  Ankle eversion 4/5   (Blank rows = not tested)    TREATMENT:                                                                                                                              DATE:  04/28/23 Manual STM with compression to Lt gastroc/soleus; skilled palpation and  monitoring of soft tissue during DN Trigger Point Dry-Needling  Treatment instructions: Expect mild to moderate muscle soreness. S/S of pneumothorax if dry needled over a lung field, and to seek immediate medical attention should they occur. Patient verbalized understanding of these instructions and education.  Patient Consent Given: Yes Education handout provided: Yes Muscles treated: Lt gastroc/soleus Electrical stimulation performed: No Parameters: n/a Treatment response/outcome: twitch responses noted  TherEx Discussed trial of calf raises to see how she responds; initially low volume and adjusting range as needed.  Progressing to single limb eccentric, then single limb on level ground.  Pt verbalized understanding and will begin incorporating into exercises.   04/20/23 Manual STM with compression to bil gastroc/soleus; skilled palpation and monitoring of soft tissue during DN Trigger Point Dry-Needling  Treatment instructions: Expect mild to moderate muscle soreness. S/S of pneumothorax if dry needled over a lung field, and to seek immediate medical attention should they occur. Patient verbalized understanding of these instructions and education.  Patient Consent Given: Yes Education handout provided: Yes Muscles treated: bil gastroc/soleus Electrical stimulation performed: Yes Parameters: 8mA freq with intensity to tolerance (~2) x 3 min bil gastroc/soleus Treatment response/outcome: twitch responses noted  TherEx ROM measurements - see above Recommended slight heel lift with squats, and how to unload LLE with household activities if pain begins; pt verbalized understanding   04/14/23 TherEx  See HEP - demonstrated for pt with min cues; pt verbalized understanding  Discussed activity modification to keep ankle in neutral as able with strength training to decrease strain on ankle - pt verbalized understanding  Manual STM with compression to Lt gastroc/soleus; skilled  palpation and monitoring of soft tissue during DN Trigger Point Dry-Needling  Treatment instructions: Expect mild to moderate muscle soreness. S/S of pneumothorax if dry needled over a lung field, and to seek immediate medical attention should they occur. Patient verbalized understanding of these instructions and education.  Patient Consent Given: Yes Education handout provided: Yes Muscles treated: Lt soleus Electrical stimulation performed: No Parameters: N/A Treatment response/outcome: twitch responses noted    PATIENT EDUCATION:  Education details: HEP, DN, use of heel lift with walking PRN Person educated: Patient Education method: Explanation, Demonstration, and Handouts Education comprehension: verbalized understanding, returned demonstration, and needs further education  HOME EXERCISE PROGRAM: Access Code: [email or web address] URL: [email or web address] Date: 04/14/2023 Prepared by: Moshe Cipro  Exercises - Gastroc Stretch on Wall  - 2 x daily - 7 x weekly - 1 sets - 3 reps - 30 sec hold - Soleus Stretch on Wall  - 2 x daily - 7 x weekly - 1 sets - 3 reps - 30 sec hold - Long Sitting Ankle Eversion with Resistance  - 2 x daily - 7 x weekly - 2 sets - 10 reps  Patient Education - Trigger Point Dry Needling   ASSESSMENT:  CLINICAL IMPRESSION: Overall reporting improvements in pain intensity and frequency.  Still having a hard time with walking longer distances at this time.  Continue skilled PT.   OBJECTIVE IMPAIRMENTS: Abnormal gait, decreased strength, increased fascial restrictions, increased muscle spasms, and pain.   ACTIVITY LIMITATIONS: standing and locomotion level  PARTICIPATION LIMITATIONS: community activity, occupation, and strength training, running  PERSONAL FACTORS: 1-2 comorbidities: ACDF, L5/S1 microdiscectomy, allergies  are also affecting patient's functional outcome.   REHAB POTENTIAL: Good  CLINICAL DECISION MAKING:  Stable/uncomplicated  EVALUATION COMPLEXITY: Low   GOALS: Goals reviewed with patient? Yes  SHORT TERM GOALS: Target date: 05/05/2023  Independent with initial HEP Goal status: MET 04/28/23  LONG TERM GOALS: Target date: 05/26/2023  Independent with final HEP Goal status: INITIAL  2.  FOTO score improved to 74 Goal status: INITIAL  3.  Lt ankle strength improved to 5/5 for improved function and mobility Goal status: INIITAL  4.  Report pain < 2/10 with running/walking/strength training for improved function Goal status: INITIAL   PLAN:  PT FREQUENCY: 1x/week  PT DURATION: 6 weeks  PLANNED INTERVENTIONS: 97164- PT Re-evaluation, 97110-Therapeutic exercises, 97530- Therapeutic activity, 97112- Neuromuscular re-education, 97535- Self Care, 11914- Manual therapy, [id number]- Gait training, [phone]- Orthotic Fit/training, [phone]- Aquatic Therapy, 97014- Electrical stimulation (unattended), 97016- Vasopneumatic device, [id number]- Ionotophoresis 4mg /ml Dexamethasone, Patient/Family education, Balance training, Taping, Dry Needling, Joint mobilization, Cryotherapy, and Moist heat.  PLAN FOR NEXT SESSION: give heel lift information, assess response to DN, repeat DN PRN   NEXT MD VISIT:  05/03/23 (Dr. Shon Baton)   Clarita Crane, PT, DPT 04/28/23 2:54 PM

## 2023-05-02 ENCOUNTER — Ambulatory Visit: Payer: BC Managed Care – PPO | Admitting: Physical Therapy

## 2023-05-02 ENCOUNTER — Encounter: Payer: Self-pay | Admitting: Physical Therapy

## 2023-05-02 DIAGNOSIS — R29898 Other symptoms and signs involving the musculoskeletal system: Secondary | ICD-10-CM | POA: Diagnosis not present

## 2023-05-02 DIAGNOSIS — M25572 Pain in left ankle and joints of left foot: Secondary | ICD-10-CM

## 2023-05-02 NOTE — Therapy (Signed)
OUTPATIENT PHYSICAL THERAPY TREATMENT   Patient Name: [name] MRN: [id number] DOB:Sep 02, 1970, 52 y.o., female Today's Date: 05/02/2023  END OF SESSION:  PT End of Session - 05/02/23 0842     Visit Number 4    Number of Visits 6    Date for PT Re-Evaluation 05/26/23    Authorization Type [employer] $55 copay, 40 PT visits    PT Start Time 0845    PT Stop Time 0923    PT Time Calculation (min) 38 min    Activity Tolerance Patient tolerated treatment well    Behavior During Therapy [facility] for tasks assessed/performed                Past Medical History:  Diagnosis Date   Asthma    Eczema    PONV (postoperative nausea and vomiting)    Umbilical hernia    Past Surgical History:  Procedure Laterality Date   ABDOMINOPLASTY     acdf     CESAREAN SECTION  2008, 2010   EYE SURGERY     catarct   MICRODISCECTOMY LUMBAR     ORIF WRIST FRACTURE Left 09/09/2020   Procedure: OPEN REDUCTION INTERNAL FIXATION (ORIF) LEFT DISTAL RADIUS ORIF;  Surgeon: Cammy Copa, MD;  Location: [facility] OR;  Service: Orthopedics;  Laterality: Left;   TUBAL LIGATION  2010   Patient Active Problem List   Diagnosis Date Noted   Childhood asthma 03/08/2023   Seasonal and perennial allergic rhinitis 03/08/2023   Contact dermatitis due to chemicals 03/08/2023   Fracture, Colles, left, closed    Umbilical hernia, reducible 01/05/2012   Rectus diastasis 01/05/2012    PCP: Marcelle Overlie, MD  REFERRING PROVIDER: Cammy Copa, MD  REFERRING DIAG: M76.60 (ICD-10-CM) - Pain in Achilles tendon   Rationale for Evaluation and Treatment: Rehabilitation  THERAPY DIAG:  Pain in left ankle and joints of left foot  Other symptoms and signs involving the musculoskeletal system  ONSET DATE: 09/2022   SUBJECTIVE:                                                                                                                                                                                            SUBJECTIVE STATEMENT: Had more pain after last session; ice and massage with muscle relaxer.  Symptoms resolved over a few hours feeling like a spasm.  Working out has been going well with modification; feels she's able to progress.  Trial of calf raises with squats over the weekend went well.  PERTINENT HISTORY:  Asthma, L5/S1 microdiscectomy 2007; C5/6 ACDF  PAIN:  Are you having pain? Yes: NPRS scale:  0 currently, up to 2-3/10 Pain location: Lt achilles Pain description: burning Aggravating factors: worse after prolonged plantarflexion, maybe running uphill Relieving factors: heat  PRECAUTIONS:  None  RED FLAGS: None   WEIGHT BEARING RESTRICTIONS:  No  FALLS:  Has patient fallen in last 6 months? 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  PLOF:  Independent and Leisure: does 30 min HIIT with Tonal  PATIENT GOALS:  Run without pain, unrestricted strength training   OBJECTIVE:   DIAGNOSTIC FINDINGS:  N/A  PATIENT SURVEYS:  04/14/23 FOTO 57 (predicted 74)  COGNITIVE STATUS: Within functional limits for tasks assessed   SENSATION: WFL   GAIT: 04/14/23 Comments: independent with amb; no significant deviations noted at this time  PALPATION: 04/14/23 trigger points noted in gastroc/soleus; tender to palpation along achilles tendon   LOWER EXTREMITY ROM:     ROM Left eval Left 04/20/23  Ankle dorsiflexion A: 7 A: 8  Ankle plantarflexion A: 70 A: 70  Ankle inversion A: 18   Ankle eversion A: 14    (Blank rows = not tested)   LOWER EXTREMITY MMT:    MMT Left eval  Ankle dorsiflexion 5/5  Ankle plantarflexion 4/5 with  pain  Ankle inversion 5/5  Ankle eversion 4/5   (Blank rows = not tested)    TREATMENT:                                                                                                                              DATE:  05/02/23 Manual IASTM  with compression and Biofreeze to Lt gastroc/soleus; use of percussive device as well with education on home use.  Pt has Biofreeze and percussive device that she will take with her on trip next week.   04/28/23 Manual STM with compression to Lt gastroc/soleus; skilled palpation and monitoring of soft tissue during DN Trigger Point Dry-Needling  Treatment instructions: Expect mild to moderate muscle soreness. S/S of pneumothorax if dry needled over a lung field, and to seek immediate medical attention should they occur. Patient verbalized understanding of these instructions and education.  Patient Consent Given: Yes Education handout provided: Yes Muscles treated: Lt gastroc/soleus Electrical stimulation performed: No Parameters: n/a Treatment response/outcome: twitch responses noted  TherEx Discussed trial of calf raises to see how she responds; initially low volume and adjusting range as needed.  Progressing to single limb eccentric, then single limb on level ground.  Pt verbalized understanding and will begin incorporating into exercises.   04/20/23 Manual STM with compression to bil gastroc/soleus; skilled palpation and monitoring of soft tissue during DN Trigger Point Dry-Needling  Treatment instructions: Expect mild to moderate muscle soreness. S/S of pneumothorax if dry needled over a lung field, and to seek immediate medical attention should they occur. Patient verbalized understanding of these instructions and education.  Patient Consent Given: Yes Education handout provided: Yes Muscles treated: bil gastroc/soleus  Electrical stimulation performed: Yes Parameters: 8mA freq with intensity to tolerance (~2) x 3 min bil gastroc/soleus Treatment response/outcome: twitch responses noted  TherEx ROM measurements - see above Recommended slight heel lift with squats, and how to unload LLE with household activities if pain begins; pt verbalized  understanding   04/14/23 TherEx See HEP - demonstrated for pt with min cues; pt verbalized understanding  Discussed activity modification to keep ankle in neutral as able with strength training to decrease strain on ankle - pt verbalized understanding  Manual STM with compression to Lt gastroc/soleus; skilled palpation and monitoring of soft tissue during DN Trigger Point Dry-Needling  Treatment instructions: Expect mild to moderate muscle soreness. S/S of pneumothorax if dry needled over a lung field, and to seek immediate medical attention should they occur. Patient verbalized understanding of these instructions and education.  Patient Consent Given: Yes Education handout provided: Yes Muscles treated: Lt soleus Electrical stimulation performed: No Parameters: N/A Treatment response/outcome: twitch responses noted    PATIENT EDUCATION:  Education details: HEP, DN, use of heel lift with walking PRN Person educated: Patient Education method: Explanation, Demonstration, and Handouts Education comprehension: verbalized understanding, returned demonstration, and needs further education  HOME EXERCISE PROGRAM: Access Code: [email or web address] URL: [email or web address] Date: 04/14/2023 Prepared by: Moshe Cipro  Exercises - Gastroc Stretch on Wall  - 2 x daily - 7 x weekly - 1 sets - 3 reps - 30 sec hold - Soleus Stretch on Wall  - 2 x daily - 7 x weekly - 1 sets - 3 reps - 30 sec hold - Long Sitting Ankle Eversion with Resistance  - 2 x daily - 7 x weekly - 2 sets - 10 reps  Patient Education - Trigger Point Dry Needling   ASSESSMENT:  CLINICAL IMPRESSION: Deferred DN today and session focused on use of IASTM with percussive device.  Reporting decreased pain following session.  Will continue to benefit from PT to maximize function.   OBJECTIVE IMPAIRMENTS: Abnormal gait, decreased strength, increased fascial restrictions, increased muscle spasms, and pain.    ACTIVITY LIMITATIONS: standing and locomotion level  PARTICIPATION LIMITATIONS: community activity, occupation, and strength training, running  PERSONAL FACTORS: 1-2 comorbidities: ACDF, L5/S1 microdiscectomy, allergies  are also affecting patient's functional outcome.   REHAB POTENTIAL: Good  CLINICAL DECISION MAKING: Stable/uncomplicated  EVALUATION COMPLEXITY: Low   GOALS: Goals reviewed with patient? Yes  SHORT TERM GOALS: Target date: 05/05/2023  Independent with initial HEP Goal status: MET 04/28/23  LONG TERM GOALS: Target date: 05/26/2023  Independent with final HEP Goal status: INITIAL  2.  FOTO score improved to 74 Goal status: INITIAL  3.  Lt ankle strength improved to 5/5 for improved function and mobility Goal status: INIITAL  4.  Report pain < 2/10 with running/walking/strength training for improved function Goal status: INITIAL   PLAN:  PT FREQUENCY: 1x/week  PT DURATION: 6 weeks  PLANNED INTERVENTIONS: 97164- PT Re-evaluation, 97110-Therapeutic exercises, 97530- Therapeutic activity, 97112- Neuromuscular re-education, 97535- Self Care, [id number]- Manual therapy, [id number]- Gait training, [phone]- Orthotic Fit/training, [phone]- Aquatic Therapy, 97014- Electrical stimulation (unattended), 97016- Vasopneumatic device, [id number]- Ionotophoresis 4mg /ml Dexamethasone, Patient/Family education, Balance training, Taping, Dry Needling, Joint mobilization, Cryotherapy, and Moist heat.  PLAN FOR NEXT SESSION: how was trip, repeat DN PRN   NEXT MD VISIT:  05/03/23 (Dr. Shon Baton)   Clarita Crane, PT, DPT 05/02/23 9:32 AM

## 2023-05-03 ENCOUNTER — Encounter: Payer: Self-pay | Admitting: Sports Medicine

## 2023-05-03 ENCOUNTER — Ambulatory Visit: Payer: BC Managed Care – PPO | Admitting: Sports Medicine

## 2023-05-03 DIAGNOSIS — M7660 Achilles tendinitis, unspecified leg: Secondary | ICD-10-CM | POA: Diagnosis not present

## 2023-05-03 NOTE — Progress Notes (Signed)
[name] - 52 y.o. female MRN [id number]  Date of birth: June 22, 1970  Office Visit Note: Visit Date: 05/03/2023 PCP: Nichole Overlie, MD Referred by: Nichole Overlie, MD  Subjective: Chief Complaint  Patient presents with   Left Ankle - Follow-up   HPI: [name] is a pleasant 52 y.o. female who presents today for follow-up of left achilles tendinopathy and pain.  She is making good progress in terms of her pain reduction.  We have performed 2 sessions of extracorporeal shockwave therapy, finding benefit.  She also is tolerating her nitroglycerin patch, changing every 24 hours.  Had a few days of mild headaches which resolved.  Continues in her 5/16 inch heel lift.  Currently has no pain at rest and was able to take a very leisurely walk without issues.  She continues crosstraining but has not returned to running/trail running.  Pertinent ROS were reviewed with the patient and found to be negative unless otherwise specified above in HPI.   Assessment & Plan: Visit Diagnoses:  1. Non-insertional Achilles tendinopathy   2. Pain in Achilles tendon    Plan: [name] is making good progress in terms of pain reduction from her Achilles tendinopathy with a combination of extracorporeal shockwave therapy, nitroglycerin patch protocol as well as PT.  We will continue this protocol, she will follow-up with me next week for repeat ESWT and reevaluation. She will continue in her 5/16 inch heel lift to help offload the Achilles, although as her pain improves we will work on weaning her out of these.  See how she responds to her increased walking for her upcoming trip to [facility] and plan over the next few weeks to slowly returning to running and Trail running as her pain allows.   Follow-up: 1-week   Meds & Orders: No orders of the defined types were placed in this encounter.  No orders of the defined types were placed in this encounter.    Procedures: Procedure: ECSWT Indications:   Left achilles tendinopathy   Procedure Details Consent: Risks of procedure as well as the alternatives and risks of each were explained to the patient.  Verbal consent for procedure obtained. Time Out: Verified patient identification, verified procedure, site was marked, verified correct patient position. The area was cleaned with alcohol swab.     The left achilles was targeted for Extracorporeal shockwave therapy.    Preset: Achillodynia Power Level: 100 Frequency:12 Hz Impulse/cycles: 3300 Head size: Regular   Patient tolerated procedure well without immediate complications.       Clinical History: No specialty comments available.  She reports that she has never smoked. She has never used smokeless tobacco. No results for input(s): "HGBA1C", "LABURIC" in the last 8760 hours.  Objective:    Physical Exam  Gen: Well-appearing, in no acute distress; non-toxic CV: Regular Rate. Well-perfused. Warm.  Resp: Breathing unlabored on room air; no wheezing. Psych: Fluid speech in conversation; appropriate affect; normal thought process Neuro: Sensation intact throughout. No gross coordination deficits.   Ortho Exam - LLE: Very small nodule over the mid aspect of the Achilles, although less prominent from initial visits.  Good range of motion with plantarflexion and dorsiflexion.  No step-off of the Achilles.  Imaging: No results found.  Past Medical/Family/Surgical/Social History: Medications & Allergies reviewed per EMR, new medications updated. Patient Active Problem List   Diagnosis Date Noted   Childhood asthma 03/08/2023   Seasonal and perennial allergic rhinitis 03/08/2023   Contact dermatitis due to chemicals 03/08/2023  Fracture, Colles, left, closed    Umbilical hernia, reducible 01/05/2012   Rectus diastasis 01/05/2012   Past Medical History:  Diagnosis Date   Asthma    Eczema    PONV (postoperative nausea and vomiting)    Umbilical hernia    Family History   Problem Relation Age of Onset   Cancer Mother        melanoma   Past Surgical History:  Procedure Laterality Date   ABDOMINOPLASTY     acdf     CESAREAN SECTION  2008, 2010   EYE SURGERY     catarct   MICRODISCECTOMY LUMBAR     ORIF WRIST FRACTURE Left 09/09/2020   Procedure: OPEN REDUCTION INTERNAL FIXATION (ORIF) LEFT DISTAL RADIUS ORIF;  Surgeon: Cammy Copa, MD;  Location: [facility] OR;  Service: Orthopedics;  Laterality: Left;   TUBAL LIGATION  2010   Social History   Occupational History   Not on file  Tobacco Use   Smoking status: Never   Smokeless tobacco: Never  Vaping Use   Vaping status: Never Used  Substance and Sexual Activity   Alcohol use: No   Drug use: No   Sexual activity: Not on file

## 2023-05-03 NOTE — Progress Notes (Signed)
Patient says that she is feeling better. She says that she has not had any nagging, although had pain and discomfort in the muscle belly after dry needling in that area. She says that this lasted for a few hours and left her sore the next day. She says that she walked 1 mile yesterday with no pain during or after.

## 2023-05-10 ENCOUNTER — Ambulatory Visit: Payer: BC Managed Care – PPO | Admitting: Sports Medicine

## 2023-05-10 ENCOUNTER — Encounter: Payer: Self-pay | Admitting: Sports Medicine

## 2023-05-10 DIAGNOSIS — M7660 Achilles tendinitis, unspecified leg: Secondary | ICD-10-CM | POA: Diagnosis not present

## 2023-05-10 NOTE — Progress Notes (Signed)
[name] - 52 y.o. female MRN [id number]  Date of birth: 03/19/1971  Office Visit Note: Visit Date: 05/10/2023 PCP: Marcelle Overlie, MD Referred by: Marcelle Overlie, MD  Subjective: Chief Complaint  Patient presents with   Left Ankle - Follow-up   HPI: [name] is a pleasant 52 y.o. female who presents today for follow-up of left achilles tendinopathy and pain.   We have performed 3 sessions of extracorporeal shockwave therapy, which she is finding benefit from. She also is tolerating her nitroglycerin patch, changing every 24 hours. Continues in her 5/16 inch heel lift.  Has been tolerating [employer], aerobic and strength workouts without any pain.  Did return from [place] and did some walking and felt it only slightly although no significant exacerbation.  At this point feels like she is at least 80% improved.  Pertinent ROS were reviewed with the patient and found to be negative unless otherwise specified above in HPI.   Assessment & Plan: Visit Diagnoses:  1. Non-insertional Achilles tendinopathy   2. Pain in Achilles tendon    Plan: [name] continues making progress from her Achilles tendinopathy with a combination of extracorporeal shockwave therapy, nitroglycerin patch protocol as well as PT/HEP.  We did repeat ESWT today.  She will plan for an additional 1 next week and after that session we will reevaluate her need for additional treatments.  She will continue in her 5/16 inch heel lift to help offload the Achilles, she will come out of this when she is no longer having any pain with walking.  She will follow-up next week.  Follow-up: Return in about 1 week (around 05/17/2023).   Meds & Orders: No orders of the defined types were placed in this encounter.  No orders of the defined types were placed in this encounter.    Procedures: Procedure: ECSWT Indications:  Left achilles tendinopathy   Procedure Details Consent: Risks of procedure as well as the  alternatives and risks of each were explained to the patient.  Verbal consent for procedure obtained. Time Out: Verified patient identification, verified procedure, site was marked, verified correct patient position. The area was cleaned with alcohol swab.     The left achilles was targeted for Extracorporeal shockwave therapy.    Preset: Achillodynia Power Level: 110 mJ Frequency: 12 Hz Impulse/cycles: 3200 Head size: Regular   Patient tolerated procedure well without immediate complications.        Clinical History: No specialty comments available.  She reports that she has never smoked. She has never used smokeless tobacco. No results for input(s): "HGBA1C", "LABURIC" in the last 8760 hours.  Objective:    Physical Exam  Gen: Well-appearing, in no acute distress; non-toxic CV: Well-perfused. Warm.  Resp: Breathing unlabored on room air; no wheezing. Psych: Fluid speech in conversation; appropriate affect; normal thought process Neuro: Sensation intact throughout. No gross coordination deficits.   Ortho Exam - LLE: There is a small residual nodule in the mid aspect of the Achilles, no tenderness to light palpation.  Full range of motion about the ankle in all directions.  There is no step-off, good calf musculature.  Imaging: No results found.  Past Medical/Family/Surgical/Social History: Medications & Allergies reviewed per EMR, new medications updated. Patient Active Problem List   Diagnosis Date Noted   Childhood asthma 03/08/2023   Seasonal and perennial allergic rhinitis 03/08/2023   Contact dermatitis due to chemicals 03/08/2023   Fracture, Colles, left, closed    Umbilical hernia, reducible 01/05/2012  Rectus diastasis 01/05/2012   Past Medical History:  Diagnosis Date   Asthma    Eczema    PONV (postoperative nausea and vomiting)    Umbilical hernia    Family History  Problem Relation Age of Onset   Cancer Mother        melanoma   Past Surgical  History:  Procedure Laterality Date   ABDOMINOPLASTY     acdf     CESAREAN SECTION  2008, 2010   EYE SURGERY     catarct   MICRODISCECTOMY LUMBAR     ORIF WRIST FRACTURE Left 09/09/2020   Procedure: OPEN REDUCTION INTERNAL FIXATION (ORIF) LEFT DISTAL RADIUS ORIF;  Surgeon: Cammy Copa, MD;  Location: [facility] OR;  Service: Orthopedics;  Laterality: Left;   TUBAL LIGATION  2010   Social History   Occupational History   Not on file  Tobacco Use   Smoking status: Never   Smokeless tobacco: Never  Vaping Use   Vaping status: Never Used  Substance and Sexual Activity   Alcohol use: No   Drug use: No   Sexual activity: Not on file

## 2023-05-10 NOTE — Progress Notes (Signed)
Patient says she is feeling better. She says that she went to [facility] for a few days and was able to walk, although she did avoid extra walking or running which she would have preferred.

## 2023-05-13 ENCOUNTER — Encounter: Payer: Self-pay | Admitting: Physical Therapy

## 2023-05-13 ENCOUNTER — Ambulatory Visit: Payer: BC Managed Care – PPO | Admitting: Physical Therapy

## 2023-05-13 DIAGNOSIS — R29898 Other symptoms and signs involving the musculoskeletal system: Secondary | ICD-10-CM

## 2023-05-13 DIAGNOSIS — M25572 Pain in left ankle and joints of left foot: Secondary | ICD-10-CM

## 2023-05-13 NOTE — Therapy (Signed)
OUTPATIENT PHYSICAL THERAPY TREATMENT   Patient Name: [name] MRN: [id number] DOB:02-08-1971, 52 y.o., female Today's Date: 05/13/2023  END OF SESSION:  PT End of Session - 05/13/23 0939     Visit Number 5    Number of Visits 6    Date for PT Re-Evaluation 05/26/23    Authorization Type [employer] $55 copay, 40 PT visits    PT Start Time 0935    PT Stop Time 1005    PT Time Calculation (min) 30 min    Activity Tolerance Patient tolerated treatment well    Behavior During Therapy [facility] for tasks assessed/performed                 Past Medical History:  Diagnosis Date   Asthma    Eczema    PONV (postoperative nausea and vomiting)    Umbilical hernia    Past Surgical History:  Procedure Laterality Date   ABDOMINOPLASTY     acdf     CESAREAN SECTION  2008, 2010   EYE SURGERY     catarct   MICRODISCECTOMY LUMBAR     ORIF WRIST FRACTURE Left 09/09/2020   Procedure: OPEN REDUCTION INTERNAL FIXATION (ORIF) LEFT DISTAL RADIUS ORIF;  Surgeon: Cammy Copa, MD;  Location: [facility] OR;  Service: Orthopedics;  Laterality: Left;   TUBAL LIGATION  2010   Patient Active Problem List   Diagnosis Date Noted   Childhood asthma 03/08/2023   Seasonal and perennial allergic rhinitis 03/08/2023   Contact dermatitis due to chemicals 03/08/2023   Fracture, Colles, left, closed    Umbilical hernia, reducible 01/05/2012   Rectus diastasis 01/05/2012    PCP: Marcelle Overlie, MD  REFERRING PROVIDER: Cammy Copa, MD  REFERRING DIAG: M76.60 (ICD-10-CM) - Pain in Achilles tendon   Rationale for Evaluation and Treatment: Rehabilitation  THERAPY DIAG:  Pain in left ankle and joints of left foot  Other symptoms and signs involving the musculoskeletal system  ONSET DATE: 09/2022   SUBJECTIVE:                                                                                                                                                                                            SUBJECTIVE STATEMENT: More sore after last session without needling; but went to [facility] and did pretty well with only some discomfort.  After shockwave Tuesday morning this week ended up with significant soreness that night.    PERTINENT HISTORY:  Asthma, L5/S1 microdiscectomy 2007; C5/6 ACDF  PAIN:  Are you having pain? Yes: NPRS scale: 0 currently, up to 2-3/10 Pain location: Lt achilles Pain description: burning Aggravating  factors: worse after prolonged plantarflexion, maybe running uphill Relieving factors: heat  PRECAUTIONS:  None  RED FLAGS: None   WEIGHT BEARING RESTRICTIONS:  No  FALLS:  Has patient fallen in last 6 months? 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  PLOF:  Independent and Leisure: does 30 min HIIT with Tonal  PATIENT GOALS:  Run without pain, unrestricted strength training   OBJECTIVE:   DIAGNOSTIC FINDINGS:  N/A  PATIENT SURVEYS:  04/14/23 FOTO 57 (predicted 74)  COGNITIVE STATUS: Within functional limits for tasks assessed   SENSATION: WFL   GAIT: 04/14/23 Comments: independent with amb; no significant deviations noted at this time  PALPATION: 04/14/23 trigger points noted in gastroc/soleus; tender to palpation along achilles tendon   LOWER EXTREMITY ROM:     ROM Left eval Left 04/20/23  Ankle dorsiflexion A: 7 A: 8  Ankle plantarflexion A: 70 A: 70  Ankle inversion A: 18   Ankle eversion A: 14    (Blank rows = not tested)   LOWER EXTREMITY MMT:    MMT Left eval  Ankle dorsiflexion 5/5  Ankle plantarflexion 4/5 with  pain  Ankle inversion 5/5  Ankle eversion 4/5   (Blank rows = not tested)    TREATMENT:                                                                                                                              DATE:  05/13/23 Manual STM with compression to Lt gastroc/soleus; skilled palpation and monitoring  of soft tissue during DN BioFreeze with STM after dry needling Trigger Point Dry-Needling  Treatment instructions: Expect mild to moderate muscle soreness. S/S of pneumothorax if dry needled over a lung field, and to seek immediate medical attention should they occur. Patient verbalized understanding of these instructions and education.  Patient Consent Given: Yes Education handout provided: Yes Muscles treated: Lt gastroc Electrical stimulation performed: No Parameters: n/a Treatment response/outcome: twitch responses noted   05/02/23 Manual IASTM with compression and Biofreeze to Lt gastroc/soleus; use of percussive device as well with education on home use.  Pt has Biofreeze and percussive device that she will take with her on trip next week.   04/28/23 Manual STM with compression to Lt gastroc/soleus; skilled palpation and monitoring of soft tissue during DN Trigger Point Dry-Needling  Treatment instructions: Expect mild to moderate muscle soreness. S/S of pneumothorax if dry needled over a lung field, and to seek immediate medical attention should they occur. Patient verbalized understanding of these instructions and education.  Patient Consent Given: Yes Education handout provided: Yes Muscles treated: Lt gastroc/soleus Electrical stimulation performed: No Parameters: n/a Treatment response/outcome: twitch responses noted  TherEx Discussed trial of calf raises to see how she responds; initially low volume and adjusting range as needed.  Progressing to single limb eccentric, then single limb on level ground.  Pt verbalized understanding and will  begin incorporating into exercises.   04/20/23 Manual STM with compression to bil gastroc/soleus; skilled palpation and monitoring of soft tissue during DN Trigger Point Dry-Needling  Treatment instructions: Expect mild to moderate muscle soreness. S/S of pneumothorax if dry needled over a lung field, and to seek immediate medical  attention should they occur. Patient verbalized understanding of these instructions and education.  Patient Consent Given: Yes Education handout provided: Yes Muscles treated: bil gastroc/soleus Electrical stimulation performed: Yes Parameters: 8mA freq with intensity to tolerance (~2) x 3 min bil gastroc/soleus Treatment response/outcome: twitch responses noted  TherEx ROM measurements - see above Recommended slight heel lift with squats, and how to unload LLE with household activities if pain begins; pt verbalized understanding   04/14/23 TherEx See HEP - demonstrated for pt with min cues; pt verbalized understanding  Discussed activity modification to keep ankle in neutral as able with strength training to decrease strain on ankle - pt verbalized understanding  Manual STM with compression to Lt gastroc/soleus; skilled palpation and monitoring of soft tissue during DN Trigger Point Dry-Needling  Treatment instructions: Expect mild to moderate muscle soreness. S/S of pneumothorax if dry needled over a lung field, and to seek immediate medical attention should they occur. Patient verbalized understanding of these instructions and education.  Patient Consent Given: Yes Education handout provided: Yes Muscles treated: Lt soleus Electrical stimulation performed: No Parameters: N/A Treatment response/outcome: twitch responses noted    PATIENT EDUCATION:  Education details: HEP, DN, use of heel lift with walking PRN Person educated: Patient Education method: Explanation, Demonstration, and Handouts Education comprehension: verbalized understanding, returned demonstration, and needs further education  HOME EXERCISE PROGRAM: Access Code: [email or web address] URL: [email or web address] Date: 04/14/2023 Prepared by: Moshe Cipro  Exercises - Gastroc Stretch on Wall  - 2 x daily - 7 x weekly - 1 sets - 3 reps - 30 sec hold - Soleus Stretch on Wall  - 2 x daily - 7 x  weekly - 1 sets - 3 reps - 30 sec hold - Long Sitting Ankle Eversion with Resistance  - 2 x daily - 7 x weekly - 2 sets - 10 reps  Patient Education - Trigger Point Dry Needling   ASSESSMENT:  CLINICAL IMPRESSION: Pt tolerated session well today with repeat of DN to current active trigger points in gastroc.  Good twitch responses noted today.  Will continue to benefit from PT to maximize function.   OBJECTIVE IMPAIRMENTS: Abnormal gait, decreased strength, increased fascial restrictions, increased muscle spasms, and pain.   ACTIVITY LIMITATIONS: standing and locomotion level  PARTICIPATION LIMITATIONS: community activity, occupation, and strength training, running  PERSONAL FACTORS: 1-2 comorbidities: ACDF, L5/S1 microdiscectomy, allergies  are also affecting patient's functional outcome.   REHAB POTENTIAL: Good  CLINICAL DECISION MAKING: Stable/uncomplicated  EVALUATION COMPLEXITY: Low   GOALS: Goals reviewed with patient? Yes  SHORT TERM GOALS: Target date: 05/05/2023  Independent with initial HEP Goal status: MET 04/28/23  LONG TERM GOALS: Target date: 05/26/2023  Independent with final HEP Goal status: INITIAL  2.  FOTO score improved to 74 Goal status: INITIAL  3.  Lt ankle strength improved to 5/5 for improved function and mobility Goal status: INIITAL  4.  Report pain < 2/10 with running/walking/strength training for improved function Goal status: INITIAL   PLAN:  PT FREQUENCY: 1x/week  PT DURATION: 6 weeks  PLANNED INTERVENTIONS: 97164- PT Re-evaluation, 97110-Therapeutic exercises, 97530- Therapeutic activity, [id number]- Neuromuscular re-education, 97535- Self Care, 47829- Manual therapy, [id number]- Gait training, [phone]- Orthotic Fit/training, [phone]-  Aquatic Therapy, 97014- Electrical stimulation (unattended), [id number]- Vasopneumatic device, [id number]- Ionotophoresis 4mg /ml Dexamethasone, Patient/Family education, Balance training, Taping, Dry Needling, Joint  mobilization, Cryotherapy, and Moist heat.  PLAN FOR NEXT SESSION: needs recert, repeat DN PRN   NEXT MD VISIT:  05/24/23 (Dr. Shon Baton)   Clarita Crane, PT, DPT 05/13/23 10:09 AM

## 2023-05-17 ENCOUNTER — Ambulatory Visit: Payer: BC Managed Care – PPO | Admitting: Sports Medicine

## 2023-05-18 ENCOUNTER — Ambulatory Visit: Payer: BC Managed Care – PPO | Admitting: Sports Medicine

## 2023-05-23 ENCOUNTER — Ambulatory Visit: Payer: BC Managed Care – PPO | Admitting: Physical Therapy

## 2023-05-23 ENCOUNTER — Encounter: Payer: Self-pay | Admitting: Physical Therapy

## 2023-05-23 DIAGNOSIS — R29898 Other symptoms and signs involving the musculoskeletal system: Secondary | ICD-10-CM

## 2023-05-23 DIAGNOSIS — M25572 Pain in left ankle and joints of left foot: Secondary | ICD-10-CM | POA: Diagnosis not present

## 2023-05-23 NOTE — Therapy (Signed)
OUTPATIENT PHYSICAL THERAPY TREATMENT RECERTIFICATION   Patient Name: [name] MRN: [id number] DOB:[birth year], 52 y.o., female Today's Date: 05/23/2023  END OF SESSION:  PT End of Session - 05/23/23 1014     Visit Number 6    Number of Visits 10    Date for PT Re-Evaluation 06/20/23    Authorization Type [employer] $55 copay, 40 PT visits    PT Start Time 1015    PT Stop Time 1053    PT Time Calculation (min) 38 min    Activity Tolerance Patient tolerated treatment well    Behavior During Therapy WFL for tasks assessed/performed                  Past Medical History:  Diagnosis Date   Asthma    Eczema    PONV (postoperative nausea and vomiting)    Umbilical hernia    Past Surgical History:  Procedure Laterality Date   ABDOMINOPLASTY     acdf     CESAREAN SECTION  2008, 2010   EYE SURGERY     catarct   MICRODISCECTOMY LUMBAR     ORIF WRIST FRACTURE Left 09/09/2020   Procedure: OPEN REDUCTION INTERNAL FIXATION (ORIF) LEFT DISTAL RADIUS ORIF;  Surgeon: Cammy Copa, MD;  Location: [facility] OR;  Service: Orthopedics;  Laterality: Left;   TUBAL LIGATION  2010   Patient Active Problem List   Diagnosis Date Noted   Childhood asthma 03/08/2023   Seasonal and perennial allergic rhinitis 03/08/2023   Contact dermatitis due to chemicals 03/08/2023   Fracture, Colles, left, closed    Umbilical hernia, reducible 01/05/2012   Rectus diastasis 01/05/2012    PCP: Marcelle Overlie, MD  REFERRING PROVIDER: Cammy Copa, MD  REFERRING DIAG: M76.60 (ICD-10-CM) - Pain in Achilles tendon   Rationale for Evaluation and Treatment: Rehabilitation  THERAPY DIAG:  Pain in left ankle and joints of left foot - Plan: PT plan of care cert/re-cert  Other symptoms and signs involving the musculoskeletal system - Plan: PT plan of care cert/re-cert  ONSET DATE: 09/2022   SUBJECTIVE:                                                                                                                                                                                            SUBJECTIVE STATEMENT: Did some walking at the beach (up to 2 miles) as well as a bunch of stairs; when back from the beach increased distance up to 3 miles and didn't have a lot of trouble.  Still wearing heel lift.  PERTINENT HISTORY:  Asthma, L5/S1 microdiscectomy 2007; C5/6 ACDF  PAIN:  Are you having pain? Yes: NPRS scale: 0 currently, up to 2-3/10 Pain location: Lt achilles Pain description: burning Aggravating factors: worse after prolonged plantarflexion, maybe running uphill Relieving factors: heat  PRECAUTIONS:  None  RED FLAGS: None   WEIGHT BEARING RESTRICTIONS:  No  FALLS:  Has patient fallen in last 6 months? 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  PLOF:  Independent and Leisure: does 30 min HIIT with Tonal  PATIENT GOALS:  Run without pain, unrestricted strength training   OBJECTIVE:   DIAGNOSTIC FINDINGS:  N/A  PATIENT SURVEYS:  04/14/23 FOTO 57 (predicted 74) 05/23/23: FOTO 77  COGNITIVE STATUS: Within functional limits for tasks assessed   SENSATION: WFL   GAIT: 04/14/23 Comments: independent with amb; no significant deviations noted at this time  PALPATION: 04/14/23 trigger points noted in gastroc/soleus; tender to palpation along achilles tendon   LOWER EXTREMITY ROM:     ROM Left eval Left 04/20/23  Ankle dorsiflexion A: 7 A: 8  Ankle plantarflexion A: 70 A: 70  Ankle inversion A: 18   Ankle eversion A: 14    (Blank rows = not tested)   LOWER EXTREMITY MMT:    MMT Left eval Left 05/23/23  Ankle dorsiflexion 5/5   Ankle plantarflexion 4/5 with  pain 5/5  Ankle inversion 5/5   Ankle eversion 4/5 5/5   (Blank rows = not tested)    TREATMENT:                                                                                                                               DATE:  05/23/23 TherEx Discussed pre running activities to trial for calf including "bouncing" and light hopping - see HEP for notes and recommendations.  Pt likely to incorporate into workouts this week to see how they go. MMT testing - see above; pt performed 25 LLE calf raises without pain  Manual IASTM with compression and Biofreeze to Lt gastroc/soleus  05/13/23 Manual STM with compression to Lt gastroc/soleus; skilled palpation and monitoring of soft tissue during DN BioFreeze with STM after dry needling Trigger Point Dry-Needling  Treatment instructions: Expect mild to moderate muscle soreness. S/S of pneumothorax if dry needled over a lung field, and to seek immediate medical attention should they occur. Patient verbalized understanding of these instructions and education.  Patient Consent Given: Yes Education handout provided: Yes Muscles treated: Lt gastroc Electrical stimulation performed: No Parameters: n/a Treatment response/outcome: twitch responses noted   05/02/23 Manual IASTM with compression and Biofreeze to Lt gastroc/soleus; use of percussive device as well with education on home use.  Pt has Biofreeze and percussive device that she will take with her on trip next week.    PATIENT EDUCATION:  Education details: HEP, DN, use of heel lift with walking PRN Person educated: Patient Education method: Explanation, Demonstration, and Handouts Education comprehension: verbalized understanding, returned  demonstration, and needs further education  HOME EXERCISE PROGRAM: Access Code: [email or web address] URL: [email or web address] Date: 05/23/2023 Prepared by: Moshe Cipro  Program Notes [facility] with foot over the benchAdd calf raise in with squatsLight bouncing calf raisesOut of the saddle on the PelotonLight hopping both legs to start then progressing to single leg as tolerated  Exercises - Gastroc Stretch on Wall   - 2 x daily - 7 x weekly - 1 sets - 3 reps - 30 sec hold - Soleus Stretch on Wall  - 2 x daily - 7 x weekly - 1 sets - 3 reps - 30 sec hold - Long Sitting Ankle Eversion with Resistance  - 2 x daily - 7 x weekly - 2 sets - 10 reps  Patient Education - Trigger Point Dry Needling   ASSESSMENT:  CLINICAL IMPRESSION: Pt has met 2 LTGs and nearly met other 2 LTGs at this time.  Plan to continue to see 1-2x/wk x 4 weeks for transition back to running as well as transition back to strength training with limited modifications.  Progressing well overall as strength and pain is significantly improved.  Will continue to benefit from PT to maximize function.   OBJECTIVE IMPAIRMENTS: Abnormal gait, decreased strength, increased fascial restrictions, increased muscle spasms, and pain.   ACTIVITY LIMITATIONS: standing and locomotion level  PARTICIPATION LIMITATIONS: community activity, occupation, and strength training, running  PERSONAL FACTORS: 1-2 comorbidities: ACDF, L5/S1 microdiscectomy, allergies  are also affecting patient's functional outcome.   REHAB POTENTIAL: Good  CLINICAL DECISION MAKING: Stable/uncomplicated  EVALUATION COMPLEXITY: Low   GOALS: Goals reviewed with patient? Yes  SHORT TERM GOALS: Target date: 05/05/2023  Independent with initial HEP Goal status: MET 04/28/23  LONG TERM GOALS: Target date: .06/20/2023  Independent with final HEP Goal status: ONGOING 05/23/23  2.  FOTO score improved to 74 Goal status: MET 05/23/23  3.  Lt ankle strength improved to 5/5 for improved function and mobility Goal status: MET 05/23/23  4.  Report pain < 2/10 with running/walking/strength training for improved function Goal status: partially met, will continue 05/23/23 (hasn't tried running)   PLAN:  PT FREQUENCY: 1-2x/week  PT DURATION: 4 weeks  PLANNED INTERVENTIONS: 97164- PT Re-evaluation, 97110-Therapeutic exercises, 97530- Therapeutic activity, 97112- Neuromuscular  re-education, 97535- Self Care, 44010- Manual therapy, [id number]- Gait training, [phone]- Orthotic Fit/training, [phone]- Aquatic Therapy, 97014- Electrical stimulation (unattended), 97016- Vasopneumatic device, [id number]- Ionotophoresis 4mg /ml Dexamethasone, Patient/Family education, Balance training, Taping, Dry Needling, Joint mobilization, Cryotherapy, and Moist heat.  PLAN FOR NEXT SESSION: how did pre running exercises go?, continue per POC   NEXT MD VISIT:  05/24/23 (Dr. Shon Baton)   Clarita Crane, PT, DPT 05/23/23 11:04 AM

## 2023-05-24 ENCOUNTER — Encounter: Payer: Self-pay | Admitting: Sports Medicine

## 2023-05-24 ENCOUNTER — Ambulatory Visit: Payer: BC Managed Care – PPO | Admitting: Sports Medicine

## 2023-05-24 DIAGNOSIS — M7660 Achilles tendinitis, unspecified leg: Secondary | ICD-10-CM | POA: Diagnosis not present

## 2023-05-24 NOTE — Progress Notes (Signed)
Patient says that she is feeling good. She says she has been able to walk with no problems, and feels normal after walking a couple of miles. She says the only trouble that she may have is going uphill and feeling the stretch through the achilles.

## 2023-05-24 NOTE — Progress Notes (Signed)
Office Visit Note   Patient: [name]           Date of Birth: [birth year]           MRN: [id number] Visit Date: 05/24/2023              Requested by: Marcelle Overlie, MD [address] [place],  [place] [address] PCP: Marcelle Overlie, MD  Medical Resident, Sports Medicine Fellow - Attending Physician Addendum:   I have independently interviewed and examined the patient myself. I have discussed the above with the original author and agree with their documentation. My edits for correction/addition/clarification have been made, see any changes above and below.   In summary, [name] continues making good progress with her distal Achilles tendinopathy with ECSWT, nitroglycerin patch protocol, and physical therapy.  We did repeat extracorporeal shockwave therapy today.  She will continue her nitroglycerin patch daily.  Discussed increasing her physical activity with alternating miles of walking/jogging over the next week and titrating as appropriate.  Will have a goal of allowing her to jog a few miles prior to her 5K on 12/26. F/u in 1-week.   Madelyn Brunner, DO Primary Care Sports Medicine Physician  [facility] - Orthopedics   Assessment & Plan: Visit Diagnoses:  1. Non-insertional Achilles tendinopathy   2. Pain in Achilles tendon    Plan: Discussed with patient that given she does have an upcoming 5k jog, we will go ahead and do at least 2 more sessions of shockwave therapy and have her start to increase activity.  Patient is advised to continue wearing the heel lifts while she is doing any long walks/jogs and to alternate walking and jogging over the next 1-2 weeks..  Patient can also continue wearing the Nitropatch.  As patient progresses on her walk/jog routine, can start take the heel lifts out and see how she is doing.  Patient was able to tolerate procedure well today without any difficulty.  Patient to follow-up in approximately 1 and half weeks for  repeat shockwave at that time and will reevaluate how she is doing with her increased activity.  Follow-Up Instructions: Follow up in 1-2 weeks for repeat shockwave and will evaluate achilles tendonitis at that time.  Orders:  No orders of the defined types were placed in this encounter.  No orders of the defined types were placed in this encounter.   Procedures:  Procedure: ECSWT Indications:  Left achilles tendinopathy   Procedure Details Consent: Risks of procedure as well as the alternatives and risks of each were explained to the patient.  Verbal consent for procedure obtained. Time Out: Verified patient identification, verified procedure, site was marked, verified correct patient position. The area was cleaned with alcohol swab.     The left achilles was targeted for Extracorporeal shockwave therapy.    Preset: Achillodynia Power Level: 110 mJ (70 mJ right over superior calcaneus) Frequency: 12 Hz Impulse/cycles: 3200 Head size: Regular   Patient tolerated procedure well without immediate complications   Clinical Data: No additional findings.   Subjective: Chief Complaint  Patient presents with   Left Ankle - Follow-up    Patient is here for follow-up after having for shockwave therapies.  Patient notes that she has been doing better and is about 80 to 85% improved.  Patient still wearing the heel lifts for long walks.  Patient is able to walk anywhere between 3 to 4 km.  Patient's goal is to be able to jog a  5K in December during his cruise.  Patient is able to walk without pain and day-to-day exercise but whenever she does her long walks she still does note some discomfort at the end of the walk and will require using some sort of heating pad over the affected area.  Patient otherwise has no other concerns at this time, patient does note that she is improving but does have a goal to be able to [place] with a 5K in a few weeks.  Patient is been working with PT as well.  Patient is still wearing the Nitropatch as well as    Review of Systems   Objective:  Physical Exam  Ortho Exam  Right Ankle: Inspection reveals no abnormalities of the Achilles or calcaneus.  There is some mild tenderness to deep palpation of the Achilles tendon itself at the distal aspect.  No tenderness over the actual insertion.  No tenderness at the myotendinous junction.  Range of motion with dorsiflexion plantarflexion are appropriate.  Strength is 5 out of 5 with dorsiflexion plantarflexion and inversion eversion.  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/08/2023   Fracture, Colles, left, closed    Umbilical hernia, reducible 01/05/2012   Rectus diastasis 01/05/2012   Past Medical History:  Diagnosis Date   Asthma    Eczema    PONV (postoperative nausea and vomiting)    Umbilical hernia     Family History  Problem Relation Age of Onset   Cancer Mother        melanoma    Past Surgical History:  Procedure Laterality Date   ABDOMINOPLASTY     acdf     CESAREAN SECTION  2008, 2010   EYE SURGERY     catarct   MICRODISCECTOMY LUMBAR     ORIF WRIST FRACTURE Left 09/09/2020   Procedure: OPEN REDUCTION INTERNAL FIXATION (ORIF) LEFT DISTAL RADIUS ORIF;  Surgeon: Cammy Copa, MD;  Location: [facility] OR;  Service: Orthopedics;  Laterality: Left;   TUBAL LIGATION  2010   Social History   Occupational History   Not on file  Tobacco Use   Smoking status: Never   Smokeless tobacco: Never  Vaping Use   Vaping status: Never Used  Substance and Sexual Activity   Alcohol use: No   Drug use: No   Sexual activity: Not on file     Procedure: ECSWT Indications:  Achilles tendonitis    Procedure Details Consent: Risks of procedure as well as the alternatives and risks of each were explained to the patient.  Verbal  consent for procedure obtained. Time Out: Verified patient identification, verified procedure, site was marked, verified correct patient position. The area was cleaned with alcohol swab.     The Left ankle was targeted for Extracorporeal shockwave therapy.    Preset: Achillodynia Power Level: 110  Frequency: 12 Impulse/cycles: 3200 Head size: Regular   Patient tolerated procedure well without immediate complications.

## 2023-05-30 ENCOUNTER — Ambulatory Visit: Payer: BC Managed Care – PPO | Admitting: Physical Therapy

## 2023-05-30 ENCOUNTER — Encounter: Payer: Self-pay | Admitting: Physical Therapy

## 2023-05-30 DIAGNOSIS — R29898 Other symptoms and signs involving the musculoskeletal system: Secondary | ICD-10-CM | POA: Diagnosis not present

## 2023-05-30 DIAGNOSIS — M25572 Pain in left ankle and joints of left foot: Secondary | ICD-10-CM

## 2023-05-30 NOTE — Therapy (Signed)
OUTPATIENT PHYSICAL THERAPY TREATMENT    Patient Name: [name] MRN: [id number] DOB:05-03-1971, 52 y.o., female Today's Date: 05/30/2023  END OF SESSION:  PT End of Session - 05/30/23 1515     Visit Number 7    Number of Visits 10    Date for PT Re-Evaluation 06/20/23    Authorization Type [employer] $55 copay, 40 PT visits    PT Start Time 1513    PT Stop Time 1551    PT Time Calculation (min) 38 min    Activity Tolerance Patient tolerated treatment well    Behavior During Therapy WFL for tasks assessed/performed                   Past Medical History:  Diagnosis Date   Asthma    Eczema    PONV (postoperative nausea and vomiting)    Umbilical hernia    Past Surgical History:  Procedure Laterality Date   ABDOMINOPLASTY     acdf     CESAREAN SECTION  2008, 2010   EYE SURGERY     catarct   MICRODISCECTOMY LUMBAR     ORIF WRIST FRACTURE Left 09/09/2020   Procedure: OPEN REDUCTION INTERNAL FIXATION (ORIF) LEFT DISTAL RADIUS ORIF;  Surgeon: Cammy Copa, MD;  Location: [facility] OR;  Service: Orthopedics;  Laterality: Left;   TUBAL LIGATION  2010   Patient Active Problem List   Diagnosis Date Noted   Childhood asthma 03/08/2023   Seasonal and perennial allergic rhinitis 03/08/2023   Contact dermatitis due to chemicals 03/08/2023   Fracture, Colles, left, closed    Umbilical hernia, reducible 01/05/2012   Rectus diastasis 01/05/2012    PCP: Marcelle Overlie, MD  REFERRING PROVIDER: Cammy Copa, MD  REFERRING DIAG: M76.60 (ICD-10-CM) - Pain in Achilles tendon   Rationale for Evaluation and Treatment: Rehabilitation  THERAPY DIAG:  Pain in left ankle and joints of left foot  Other symptoms and signs involving the musculoskeletal system  ONSET DATE: 09/2022   SUBJECTIVE:                                                                                                                                                                                            SUBJECTIVE STATEMENT: Tried to do a little running, walked one mile then ran a mile, then at about 2.6 miles when walking heel was much more aggravated.  Has started a different strength training that incorporates more calf work.  PERTINENT HISTORY:  Asthma, L5/S1 microdiscectomy 2007; C5/6 ACDF  PAIN:  Are you having pain? Yes: NPRS scale: 0 currently, up to 2-3/10 Pain location: Lt  achilles Pain description: burning Aggravating factors: worse after prolonged plantarflexion, maybe running uphill Relieving factors: heat  PRECAUTIONS:  None  RED FLAGS: None   WEIGHT BEARING RESTRICTIONS:  No  FALLS:  Has patient fallen in last 6 months? 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  PLOF:  Independent and Leisure: does 30 min HIIT with Tonal  PATIENT GOALS:  Run without pain, unrestricted strength training   OBJECTIVE:   DIAGNOSTIC FINDINGS:  N/A  PATIENT SURVEYS:  04/14/23 FOTO 57 (predicted 74) 05/23/23: FOTO 77  COGNITIVE STATUS: Within functional limits for tasks assessed   SENSATION: WFL   GAIT: 04/14/23 Comments: independent with amb; no significant deviations noted at this time  PALPATION: 04/14/23 trigger points noted in gastroc/soleus; tender to palpation along achilles tendon   LOWER EXTREMITY ROM:     ROM Left eval Left 04/20/23  Ankle dorsiflexion A: 7 A: 8  Ankle plantarflexion A: 70 A: 70  Ankle inversion A: 18   Ankle eversion A: 14    (Blank rows = not tested)   LOWER EXTREMITY MMT:    MMT Left eval Left 05/23/23  Ankle dorsiflexion 5/5   Ankle plantarflexion 4/5 with  pain 5/5  Ankle inversion 5/5   Ankle eversion 4/5 5/5   (Blank rows = not tested)    TREATMENT:                                                                                                                              DATE:  05/30/23 Manual STM with compression to  Lt gastroc/soleus; skilled palpation and monitoring of soft tissue during DN BioFreeze with STM after dry needling Trigger Point Dry-Needling  Treatment instructions: Expect mild to moderate muscle soreness. S/S of pneumothorax if dry needled over a lung field, and to seek immediate medical attention should they occur. Patient verbalized understanding of these instructions and education.  Patient Consent Given: Yes Education handout provided: Yes Muscles treated: Lt medial soleus Electrical stimulation performed: No Parameters: n/a Treatment response/outcome: twitch responses noted  TherEx Recommended trial of less time running to see how this goes and to continue strengthening program and working on more calf strengthening; pt with excellent understanding of activity modification.  05/23/23 TherEx Discussed pre running activities to trial for calf including "bouncing" and light hopping - see HEP for notes and recommendations.  Pt likely to incorporate into workouts this week to see how they go. MMT testing - see above; pt performed 25 LLE calf raises without pain  Manual IASTM with compression and Biofreeze to Lt gastroc/soleus  05/13/23 Manual STM with compression to Lt gastroc/soleus; skilled palpation and monitoring of soft tissue during DN BioFreeze with STM after dry needling Trigger Point Dry-Needling  Treatment instructions: Expect mild to moderate muscle soreness. S/S of pneumothorax if dry needled over a lung field, and to seek immediate medical attention should they  occur. Patient verbalized understanding of these instructions and education.  Patient Consent Given: Yes Education handout provided: Yes Muscles treated: Lt gastroc Electrical stimulation performed: No Parameters: n/a Treatment response/outcome: twitch responses noted   05/02/23 Manual IASTM with compression and Biofreeze to Lt gastroc/soleus; use of percussive device as well with education on home use.   Pt has Biofreeze and percussive device that she will take with her on trip next week.    PATIENT EDUCATION:  Education details: HEP, DN, use of heel lift with walking PRN Person educated: Patient Education method: Explanation, Demonstration, and Handouts Education comprehension: verbalized understanding, returned demonstration, and needs further education  HOME EXERCISE PROGRAM: Access Code: [email or web address] URL: [email or web address] Date: 05/23/2023 Prepared by: Moshe Cipro  Program Notes Try [facility] with foot over the benchAdd calf raise in with squatsLight bouncing calf raisesOut of the saddle on the PelotonLight hopping both legs to start then progressing to single leg as tolerated  Exercises - Gastroc Stretch on Wall  - 2 x daily - 7 x weekly - 1 sets - 3 reps - 30 sec hold - Soleus Stretch on Wall  - 2 x daily - 7 x weekly - 1 sets - 3 reps - 30 sec hold - Long Sitting Ankle Eversion with Resistance  - 2 x daily - 7 x weekly - 2 sets - 10 reps  Patient Education - Trigger Point Dry Needling   ASSESSMENT:  CLINICAL IMPRESSION: Pt tolerated session well today with trial of DN again due to increased pain after trial of running.  Still improving overall but progress is slow.  Will continue to benefit from PT to maximize function.   OBJECTIVE IMPAIRMENTS: Abnormal gait, decreased strength, increased fascial restrictions, increased muscle spasms, and pain.   ACTIVITY LIMITATIONS: standing and locomotion level  PARTICIPATION LIMITATIONS: community activity, occupation, and strength training, running  PERSONAL FACTORS: 1-2 comorbidities: ACDF, L5/S1 microdiscectomy, allergies  are also affecting patient's functional outcome.   REHAB POTENTIAL: Good  CLINICAL DECISION MAKING: Stable/uncomplicated  EVALUATION COMPLEXITY: Low   GOALS: Goals reviewed with patient? Yes  SHORT TERM GOALS: Target date: 05/05/2023  Independent with initial  HEP Goal status: MET 04/28/23  LONG TERM GOALS: Target date: .06/20/2023  Independent with final HEP Goal status: ONGOING 05/23/23  2.  FOTO score improved to 74 Goal status: MET 05/23/23  3.  Lt ankle strength improved to 5/5 for improved function and mobility Goal status: MET 05/23/23  4.  Report pain < 2/10 with running/walking/strength training for improved function Goal status: partially met, will continue 05/23/23 (hasn't tried running)   PLAN:  PT FREQUENCY: 1-2x/week  PT DURATION: 4 weeks  PLANNED INTERVENTIONS: 97164- PT Re-evaluation, 97110-Therapeutic exercises, 97530- Therapeutic activity, 97112- Neuromuscular re-education, 97535- Self Care, 54098- Manual therapy, [id number]- Gait training, [phone]- Orthotic Fit/training, [phone]- Aquatic Therapy, 97014- Electrical stimulation (unattended), 97016- Vasopneumatic device, [id number]- Ionotophoresis 4mg /ml Dexamethasone, Patient/Family education, Balance training, Taping, Dry Needling, Joint mobilization, Cryotherapy, and Moist heat.  PLAN FOR NEXT SESSION: continue per POC; manual/DN PRN   NEXT MD VISIT:  06/06/23  (Dr. Shon Baton)   Clarita Crane, PT, DPT 05/30/23 3:55 PM

## 2023-05-31 ENCOUNTER — Encounter: Payer: Self-pay | Admitting: Allergy & Immunology

## 2023-05-31 ENCOUNTER — Other Ambulatory Visit: Payer: Self-pay

## 2023-05-31 ENCOUNTER — Ambulatory Visit: Payer: BC Managed Care – PPO | Admitting: Allergy & Immunology

## 2023-05-31 VITALS — BP 112/70 | HR 76 | Temp 98.7°F | Resp 16 | Ht 63.19 in | Wt 140.3 lb

## 2023-05-31 DIAGNOSIS — L253 Unspecified contact dermatitis due to other chemical products: Secondary | ICD-10-CM | POA: Diagnosis not present

## 2023-05-31 DIAGNOSIS — J4520 Mild intermittent asthma, uncomplicated: Secondary | ICD-10-CM | POA: Diagnosis not present

## 2023-05-31 DIAGNOSIS — Z23 Encounter for immunization: Secondary | ICD-10-CM

## 2023-05-31 DIAGNOSIS — J3089 Other allergic rhinitis: Secondary | ICD-10-CM | POA: Diagnosis not present

## 2023-05-31 DIAGNOSIS — J302 Other seasonal allergic rhinitis: Secondary | ICD-10-CM

## 2023-05-31 MED ORDER — AZELASTINE HCL 0.1 % NA SOLN: 1.0000 | Freq: Two times a day (BID) | NASAL | 3 refills | Status: DC

## 2023-05-31 NOTE — Patient Instructions (Addendum)
1. Seasonal and perennial allergic rhinitis - Previous testing today showed: grasses, ragweed, weeds, trees, indoor molds, and outdoor molds - I hope we can get the Zerviate approved.  - Continue taking: Singulair (montelukast) 10mg  daily  - Start taking a nasal antihistamine such as Astelin or Astepro (ma 2 sprays per nostril twice daily). - I sent in a 90 day supply.  - Singulair can cause irritability and bad dreams, so beware of this and stop if this happens. - You can use an extra dose of the antihistamine, if needed, for breakthrough symptoms.  - Consider nasal saline rinses 1-2 times daily to remove allergens from the nasal cavities as well as help with mucous clearance (this is especially helpful to do before the nasal sprays are given) - Consider allergy shots as a means of long-term control. - We can hold off on shots for now.   2. Intermittent asthma, uncomplicated - Lung testing looks amazing today (more than 100%). - Continue with albuterol as needed.   3. Return in about 1 year (around 05/30/2024). You can have the follow up appointment with Dr. Dellis Anes or a Nurse Practicioner (our Nurse Practitioners are excellent and always have Physician oversight!).    Please inform us of any Emergency Department visits, hospitalizations, or changes in symptoms. Call us before going to the ED for breathing or allergy symptoms since we might be able to fit you in for a sick visit. Feel free to contact us anytime with any questions, problems, or concerns.  It was a pleasure to see you again today!  Websites that have reliable patient information: 1. American Academy of Asthma, Allergy, and Immunology: www.aaaai.org 2. Food Allergy Research and Education (FARE): foodallergy.org 3. Mothers of Asthmatics: http://www.asthmacommunitynetwork.org 4. American College of Allergy, Asthma, and Immunology: www.acaai.org      "Like" [place] on Facebook and Instagram for our latest updates!      A  healthy democracy works best when [employer] participate! Make sure you are registered to vote! If you have moved or changed any of your contact information, you will need to get this updated before voting! Scan the QR codes below to learn more!

## 2023-05-31 NOTE — Progress Notes (Unsigned)
Immunotherapy   Patient Details  Name: [name] MRN: [id number] Date of Birth: [birth year]  05/31/2023  [name]  Patient received the Pneumovax today  NDC: 4098-1191-47 LOT: [id number] EXP: 07/14/2024  [name] 05/31/2023, 3:00 PM

## 2023-05-31 NOTE — Progress Notes (Unsigned)
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 Seasonal and perennial allergic rhinitis - Previous testing today showed: grasses, ragweed, weeds, trees, indoor molds, and outdoor molds - I hope we can get the Zerviate approved.  - Continue taking: Singulair (montelukast) 10mg  daily  - Start taking a nasal antihistamine such as Astelin or Astepro (max 2 sprays per nostril twice daily). - I sent in a 90 day supply.  - Singulair can cause irritability and bad dreams, so beware of this and stop if this happens. - You can use an extra dose of the antihistamine, if needed, for breakthrough symptoms.  - Consider nasal saline rinses 1-2 times daily to remove allergens from the nasal cavities as well as help with mucous clearance (this is especially helpful to do before the nasal sprays are given) - Consider allergy shots as a means of long-term control. - We can hold off on shots for now.   2. Intermittent asthma, uncomplicated - Lung testing looks amazing today (more than 100%). - Continue with albuterol as needed.  - Pneumovax administered today.   3. Return in about 1 year (around 05/30/2024). You can have the follow up appointment with Dr. Dellis Anes or a Nurse Practicioner (our Nurse Practitioners are excellent and always have Physician oversight!).    Subjective:   [name] is a 52 y.o. female presenting today for follow up of  Chief Complaint  Patient presents with   Allergic Rhinitis     States that her itchy eyes are improved.   Asthma    Asthma is good, hasn't needed inhaler at all. Still snores a little and  wakes up a little congested.   Eczema    States that there are no problems.    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  Discussed the use of AI scribe software for clinical note transcription with the patient and/or guardian, who gave verbal consent to proceed.  [name] is a 52 y.o. female presenting for a follow up visit.  She was last seen in September 2024.  At that time, she had testing that was positive to multiple indoor and outdoor allergens.  We stopped her current medications and started her on Singulair and Zerviate eyedrops.  She has a history of reacting to a, preservative and multiuse eyedrops, so we thought that Zerviate might be a good choice because it come in individual ampules.  Since last visit, [name] has done fairly well.  She presents with ongoing concerns about her eye health. She reports that the previously recommended Zerviate was unavailable due to manufacturing issues and that the preservative free version of [place] has been discontinued. Despite these setbacks, the patient notes an improvement in her eye symptoms, potentially due to the initiation of montelukast for her allergies. She expresses a desire to continue this medication  due to its perceived benefits. This is usually a worse time of the year for her ocular symptoms due to the dry air, but she is doing fairly well for the most part.   The patient also reports nightly nasal congestion and snoring, which she describes as 'purring.' She has been unable to use steroid nasal sprays due to steroid-sensitive eyes and is open to trying antihistamine nasal sprays. She  notes that her congestion worsens in hotel rooms and other environments she cannot control, despite maintaining a clean home environment.  The patient's asthma is reportedly under control, with no need for regular medication. She reports a reactivation of bronchospasm a few years ago due to timolol eye drops and a subsequent upper respiratory infection. However, she is currently able to exercise in cold weather without issue. She has a supply of albuterol for emergencies.  The patient also mentions a history of childhood asthma and a recent completion of the shingles vaccine. She expresses a desire to receive the Pneumovax vaccine due to her asthma.    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   Otherwise, there have been no changes to her past medical history, surgical history, family history, or social history.    Review of systems otherwise negative other than that mentioned in the HPI.    Objective:   Blood pressure 112/70, pulse 76, temperature 98.7 F (37.1 C), temperature source Temporal, resp. rate 16, height 5' 3.19" (1.605 m), weight 140 lb 4.8 oz (63.6 kg), SpO2 98%. Body mass index is 24.7 kg/m.    Physical Exam Vitals reviewed.  Constitutional:      Appearance: She is well-developed.     Comments: Friendly. Cooperative with the exam.   HENT:     Head: Normocephalic and atraumatic.     Right Ear: Tympanic membrane, ear canal and external ear normal. No drainage, swelling or tenderness. Tympanic membrane is not injected, scarred, erythematous, retracted or bulging.     Left Ear: Tympanic membrane, ear canal and external ear normal. No drainage, swelling or tenderness. Tympanic membrane is not injected, scarred, erythematous, retracted or bulging.     Nose: No nasal deformity, septal deviation, mucosal edema or rhinorrhea.     Right Sinus: No  maxillary sinus tenderness or frontal sinus tenderness.     Left Sinus: No maxillary sinus tenderness or frontal sinus tenderness.     Mouth/Throat:     Mouth: Mucous membranes are not pale and not dry.     Pharynx: Uvula midline.  Eyes:     General:        Right eye: No discharge.        Left eye: No discharge.     Conjunctiva/sclera: Conjunctivae normal.     Right eye: Right conjunctiva is not injected. No chemosis.    Left eye: Left conjunctiva is not injected. No chemosis.    Pupils: Pupils are equal, round, and reactive to light.  Cardiovascular:     Rate and Rhythm: Normal rate and regular rhythm.     Heart sounds: Normal heart sounds.  Pulmonary:     Effort: Pulmonary effort is normal. No tachypnea, accessory muscle usage or respiratory distress.     Breath sounds: Normal breath sounds. No wheezing, rhonchi or rales.  Chest:     Chest wall: No tenderness.  Lymphadenopathy:     Head:     Right  side of head: No submandibular, tonsillar or occipital adenopathy.     Left side of head: No submandibular, tonsillar or occipital adenopathy.     Cervical: No cervical adenopathy.  Skin:    Coloration: Skin is not pale.     Findings: No abrasion, erythema, petechiae or rash. Rash is not papular, urticarial or vesicular.  Neurological:     Mental Status: She is alert.  Psychiatric:        Behavior: Behavior is cooperative.      Diagnostic studies:   Spirometry:      Spirometry consistent with normal pattern.    Allergy Studies: none        Malachi Bonds, MD  [facility]

## 2023-06-01 ENCOUNTER — Encounter: Payer: Self-pay | Admitting: Allergy & Immunology

## 2023-06-06 ENCOUNTER — Other Ambulatory Visit: Payer: Self-pay | Admitting: *Deleted

## 2023-06-06 ENCOUNTER — Ambulatory Visit: Payer: BC Managed Care – PPO | Admitting: Sports Medicine

## 2023-06-06 ENCOUNTER — Encounter: Payer: Self-pay | Admitting: Sports Medicine

## 2023-06-06 ENCOUNTER — Encounter: Payer: Self-pay | Admitting: Allergy & Immunology

## 2023-06-06 DIAGNOSIS — M7660 Achilles tendinitis, unspecified leg: Secondary | ICD-10-CM | POA: Diagnosis not present

## 2023-06-06 MED ORDER — MONTELUKAST SODIUM 10 MG PO TABS: 10.0000 mg | ORAL_TABLET | Freq: Every day | ORAL | 1 refills | Status: DC

## 2023-06-06 NOTE — Progress Notes (Signed)
Patient says that she tried to run (walk one mile, run one mile, walk one mile) and by 2.6 miles she had nagging and burning pain that lasted for a few days after. She says that during this time she was at a consistent 4/10 pain. Patient says that she also tried removing the heel lifts from her shoes and had pain immediately, so she put them back into her shoes. She says that in the time since, she has been able to walk a full 3 miles with no pain. She says that when she had the constant pain and burning she tried Advil, ice, and heat with little relief.

## 2023-06-06 NOTE — Progress Notes (Signed)
[name] - 52 y.o. female MRN [id number]  Date of birth: [birth year]  Office Visit Note: Visit Date: 06/06/2023 PCP: Marcelle Overlie, MD Referred by: Marcelle Overlie, MD  Subjective: Chief Complaint  Patient presents with   Left Ankle - Follow-up   HPI: [name] is a pleasant 52 y.o. female who presents today for follow-up of mid substance left Achilles tendinopathy.  We have performed 5 sessions of extracorporeal shockwave therapy, which she has been finding benefit from.  Prior to, she had no pain with walking, her aerobic and weight resistance physical activity.  Since last visit she tried increasing her running from a walk-run alternation in about 2.6 miles and she had some pain right in the mid aspect of the Achilles.  It has improved slightly since that time. She continues in her heel lifts as she feels more comfortable and with less pain with these.  She is still using the nitroglycerin patch changing once daily.   She is undergoing formalized PT with [facility] Cipro.    Pertinent ROS were reviewed with the patient and found to be negative unless otherwise specified above in HPI.   Assessment & Plan: Visit Diagnoses:  1. Non-insertional Achilles tendinopathy   2. Pain in Achilles tendon    Plan: [name] has made excellent improvement in her mid substance Achilles tendinopathy with a combination of extracorporeal shockwave therapy, nitroglycerin patch protocol and formalized PT/HEP.  She had a slight setback as she returned back into running but this has started to settle down and she has been tolerating other physical activity without much pain.  We did discuss the nature of guide return to running and other impact activity starting with things such as quick heel raises, mild jump rope and a graded walk/run alternation.  I do not think she reaggravated the Achilles but is dealing with some soreness as she gets back into loading the Achilles with running and other  activities she has not performed with the last few months.  We did repeat a final session of extracorporeal shockwave therapy.  At this point I would like her to take a holiday for at least 1 month and work both on her own and with formalized PT to a graded return to activity and see how she does.  Continue to work on Achilles concentric and eccentric exercises to help strengthen and support her posterior chain.  She will complete her 12 weeks of the nitroglycerin patch 0.2 milligrams per hour and then will discontinue at that 70-month timeframe.  She will follow-up with me in about 1 month if she is not finding further benefit.  We did discuss considering trying a few additional extracorporeal shockwave treatments versus other modalities such as PRP injection therapy or sending a referral for other non-surgical treatment such as Tenex.  Follow-up: Return in about 4 weeks (around 07/04/2023), or if symptoms worsen or fail to improve, for L-achilles.   Meds & Orders: No orders of the defined types were placed in this encounter.  No orders of the defined types were placed in this encounter.    Procedures: Procedure: ECSWT Indications:  Left achilles tendinopathy   Procedure Details Consent: Risks of procedure as well as the alternatives and risks of each were explained to the patient.  Verbal consent for procedure obtained. Time Out: Verified patient identification, verified procedure, site was marked, verified correct patient position. The area was cleaned with alcohol swab.     The left achilles was targeted for Extracorporeal  shockwave therapy.    Preset: Achillodynia Power Level: 110-120 mJ Frequency: 12 Hz Impulse/cycles: 3300 Head size: Regular   Patient tolerated procedure well without immediate complications.        Clinical History: No specialty comments available.  She reports that she has never smoked. She has never been exposed to tobacco smoke. She has never used smokeless  tobacco. No results for input(s): "HGBA1C", "LABURIC" in the last 8760 hours.  Objective:    Physical Exam  Gen: Well-appearing, in no acute distress; non-toxic CV: Well-perfused. Warm.  Resp: Breathing unlabored on room air; no wheezing. Psych: Fluid speech in conversation; appropriate affect; normal thought process Neuro: Sensation intact throughout. No gross coordination deficits.   Ortho Exam - LLE: There is a small residual nodule in the mid aspect of the Achilles, no tenderness to light palpation.  Full range of motion about the ankle in all directions.  There is no step-off, good calf musculature.  She is able to perform bilateral heel raise without much pain, good firing of the posterior tibial tendon.  Imaging: No results found.  Past Medical/Family/Surgical/Social History: Medications & Allergies reviewed per EMR, new medications updated. Patient Active Problem List   Diagnosis Date Noted   Childhood asthma 03/08/2023   Seasonal and perennial allergic rhinitis 03/08/2023   Contact dermatitis due to chemicals 03/08/2023   Fracture, Colles, left, closed    Umbilical hernia, reducible 01/05/2012   Rectus diastasis 01/05/2012   Past Medical History:  Diagnosis Date   Asthma    Eczema    PONV (postoperative nausea and vomiting)    Umbilical hernia    Family History  Problem Relation Age of Onset   Cancer Mother        melanoma   Past Surgical History:  Procedure Laterality Date   ABDOMINOPLASTY     acdf     CESAREAN SECTION  2008, 2010   EYE SURGERY     catarct   MICRODISCECTOMY LUMBAR     ORIF WRIST FRACTURE Left 09/09/2020   Procedure: OPEN REDUCTION INTERNAL FIXATION (ORIF) LEFT DISTAL RADIUS ORIF;  Surgeon: Cammy Copa, MD;  Location: [facility] OR;  Service: Orthopedics;  Laterality: Left;   TUBAL LIGATION  2010   Social History   Occupational History   Not on file  Tobacco Use   Smoking status: Never    Passive exposure: Never   Smokeless tobacco:  Never  Vaping Use   Vaping status: Never Used  Substance and Sexual Activity   Alcohol use: No   Drug use: No   Sexual activity: Not on file

## 2023-06-07 ENCOUNTER — Encounter: Payer: Self-pay | Admitting: Physical Therapy

## 2023-06-07 ENCOUNTER — Ambulatory Visit: Payer: BC Managed Care – PPO | Admitting: Physical Therapy

## 2023-06-07 DIAGNOSIS — R29898 Other symptoms and signs involving the musculoskeletal system: Secondary | ICD-10-CM | POA: Diagnosis not present

## 2023-06-07 DIAGNOSIS — M25572 Pain in left ankle and joints of left foot: Secondary | ICD-10-CM | POA: Diagnosis not present

## 2023-06-07 NOTE — Therapy (Addendum)
 OUTPATIENT PHYSICAL THERAPY TREATMENT DISCHARGE SUMMARY    Patient Name: [name] MRN: [id number] DOB:1970/07/14, 52 y.o., female Today's Date: 06/08/2023  END OF SESSION:  PT End of Session - 06/07/23 1057     Visit Number 8    Number of Visits 10    Date for PT Re-Evaluation 06/20/23    Authorization Type [employer] $55 copay, 40 PT visits    PT Start Time 1100    PT Stop Time 1130    PT Time Calculation (min) 30 min    Activity Tolerance Patient tolerated treatment well    Behavior During Therapy WFL for tasks assessed/performed                    Past Medical History:  Diagnosis Date   Asthma    Eczema    PONV (postoperative nausea and vomiting)    Umbilical hernia    Past Surgical History:  Procedure Laterality Date   ABDOMINOPLASTY     acdf     CESAREAN SECTION  2008, 2010   EYE SURGERY     catarct   MICRODISCECTOMY LUMBAR     ORIF WRIST FRACTURE Left 09/09/2020   Procedure: OPEN REDUCTION INTERNAL FIXATION (ORIF) LEFT DISTAL RADIUS ORIF;  Surgeon: Cammy Copa, MD;  Location: [facility] OR;  Service: Orthopedics;  Laterality: Left;   TUBAL LIGATION  2010   Patient Active Problem List   Diagnosis Date Noted   Childhood asthma 03/08/2023   Seasonal and perennial allergic rhinitis 03/08/2023   Contact dermatitis due to chemicals 03/08/2023   Fracture, Colles, left, closed    Umbilical hernia, reducible 01/05/2012   Rectus diastasis 01/05/2012    PCP: Marcelle Overlie, MD  REFERRING PROVIDER: Cammy Copa, MD  REFERRING DIAG: M76.60 (ICD-10-CM) - Pain in Achilles tendon   Rationale for Evaluation and Treatment: Rehabilitation  THERAPY DIAG:  Pain in left ankle and joints of left foot  Other symptoms and signs involving the musculoskeletal system  ONSET DATE: 09/2022   SUBJECTIVE:                                                                                                                                                                                            SUBJECTIVE STATEMENT: Did more of a walk/run; ran the downhill and walked the up.  No pain with 3 miles. Heel lift is still in; and did get the adjustable.    PERTINENT HISTORY:  Asthma, L5/S1 microdiscectomy 2007; C5/6 ACDF  PAIN:  Are you having pain? Yes: NPRS scale: 0 currently, up to 2-3/10 Pain location: Lt achilles Pain description: burning  Aggravating factors: worse after prolonged plantarflexion, maybe running uphill Relieving factors: heat  PRECAUTIONS:  None  RED FLAGS: None   WEIGHT BEARING RESTRICTIONS:  No  FALLS:  Has patient fallen in last 6 months? 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  PLOF:  Independent and Leisure: does 30 min HIIT with Tonal  PATIENT GOALS:  Run without pain, unrestricted strength training   OBJECTIVE:   DIAGNOSTIC FINDINGS:  N/A  PATIENT SURVEYS:  04/14/23 FOTO 57 (predicted 74) 05/23/23: FOTO 77  COGNITIVE STATUS: Within functional limits for tasks assessed   SENSATION: WFL   GAIT: 04/14/23 Comments: independent with amb; no significant deviations noted at this time  PALPATION: 04/14/23 trigger points noted in gastroc/soleus; tender to palpation along achilles tendon   LOWER EXTREMITY ROM:     ROM Left eval Left 04/20/23  Ankle dorsiflexion A: 7 A: 8  Ankle plantarflexion A: 70 A: 70  Ankle inversion A: 18   Ankle eversion A: 14    (Blank rows = not tested)   LOWER EXTREMITY MMT:    MMT Left eval Left 05/23/23  Ankle dorsiflexion 5/5   Ankle plantarflexion 4/5 with  pain 5/5  Ankle inversion 5/5   Ankle eversion 4/5 5/5   (Blank rows = not tested)    TREATMENT:                                                                                                                              DATE:  06/07/23 Manual IASTM with compression and Biofreeze to Lt  gastroc/soleus  TherEx Reviewed current exercise program and overall progress; plan to continue with current strength training programs and progressing walk/jog as able to increase activity and return to running.    05/30/23 Manual STM with compression to Lt gastroc/soleus; skilled palpation and monitoring of soft tissue during DN BioFreeze with STM after dry needling Trigger Point Dry-Needling  Treatment instructions: Expect mild to moderate muscle soreness. S/S of pneumothorax if dry needled over a lung field, and to seek immediate medical attention should they occur. Patient verbalized understanding of these instructions and education.  Patient Consent Given: Yes Education handout provided: Yes Muscles treated: Lt medial soleus Electrical stimulation performed: No Parameters: n/a Treatment response/outcome: twitch responses noted  TherEx Recommended trial of less time running to see how this goes and to continue strengthening program and working on more calf strengthening; pt with excellent understanding of activity modification.  05/23/23 TherEx Discussed pre running activities to trial for calf including "bouncing" and light hopping - see HEP for notes and recommendations.  Pt likely to incorporate into workouts this week to see how they go. MMT testing - see above; pt performed 25 LLE calf raises without pain  Manual IASTM with compression and Biofreeze to Lt gastroc/soleus  05/13/23 Manual STM with compression to Lt gastroc/soleus; skilled palpation and monitoring of soft tissue  during DN BioFreeze with STM after dry needling Trigger Point Dry-Needling  Treatment instructions: Expect mild to moderate muscle soreness. S/S of pneumothorax if dry needled over a lung field, and to seek immediate medical attention should they occur. Patient verbalized understanding of these instructions and education.  Patient Consent Given: Yes Education handout provided: Yes Muscles treated:  Lt gastroc Electrical stimulation performed: No Parameters: n/a Treatment response/outcome: twitch responses noted   05/02/23 Manual IASTM with compression and Biofreeze to Lt gastroc/soleus; use of percussive device as well with education on home use.  Pt has Biofreeze and percussive device that she will take with her on trip next week.    PATIENT EDUCATION:  Education details: HEP, DN, use of heel lift with walking PRN Person educated: Patient Education method: Explanation, Demonstration, and Handouts Education comprehension: verbalized understanding, returned demonstration, and needs further education  HOME EXERCISE PROGRAM: Access Code: [email or web address] URL: [email or web address] Date: 05/23/2023 Prepared by: Moshe Cipro  Program Notes Try [facility] with foot over the benchAdd calf raise in with squatsLight bouncing calf raisesOut of the saddle on the PelotonLight hopping both legs to start then progressing to single leg as tolerated  Exercises - Gastroc Stretch on Wall  - 2 x daily - 7 x weekly - 1 sets - 3 reps - 30 sec hold - Soleus Stretch on Wall  - 2 x daily - 7 x weekly - 1 sets - 3 reps - 30 sec hold - Long Sitting Ankle Eversion with Resistance  - 2 x daily - 7 x weekly - 2 sets - 10 reps  Patient Education - Trigger Point Dry Needling   ASSESSMENT:  CLINICAL IMPRESSION: Pt has met all goals at this time and is doing really well with current exercise program.  Plan is to hold PT as pt has a 5K coming up so if she needs to return after this she can, otherwise we will d/c PT.      OBJECTIVE IMPAIRMENTS: Abnormal gait, decreased strength, increased fascial restrictions, increased muscle spasms, and pain.   ACTIVITY LIMITATIONS: standing and locomotion level  PARTICIPATION LIMITATIONS: community activity, occupation, and strength training, running  PERSONAL FACTORS: 1-2 comorbidities: ACDF, L5/S1 microdiscectomy, allergies  are also  affecting patient's functional outcome.   REHAB POTENTIAL: Good  CLINICAL DECISION MAKING: Stable/uncomplicated  EVALUATION COMPLEXITY: Low   GOALS: Goals reviewed with patient? Yes  SHORT TERM GOALS: Target date: 05/05/2023  Independent with initial HEP Goal status: MET 04/28/23  LONG TERM GOALS: Target date: .06/20/2023  Independent with final HEP Goal status: MET 06/07/23  2.  FOTO score improved to 74 Goal status: MET 05/23/23  3.  Lt ankle strength improved to 5/5 for improved function and mobility Goal status: MET 05/23/23  4.  Report pain < 2/10 with running/walking/strength training for improved function Goal status: MET with current plan of walk/jog 06/07/23   PLAN:  PT FREQUENCY: 1-2x/week  PT DURATION: 4 weeks  PLANNED INTERVENTIONS: 97164- PT Re-evaluation, 97110-Therapeutic exercises, 97530- Therapeutic activity, 97112- Neuromuscular re-education, 97535- Self Care, 54098- Manual therapy, [id number]- Gait training, [phone]- Orthotic Fit/training, [phone]- Aquatic Therapy, 97014- Electrical stimulation (unattended), 97016- Vasopneumatic device, [id number]- Ionotophoresis 4mg /ml Dexamethasone, Patient/Family education, Balance training, Taping, Dry Needling, Joint mobilization, Cryotherapy, and Moist heat.  PLAN FOR NEXT SESSION: Hold PT x 30 days; d/c if pt doesn't return, if she returns will need recert/reassess   NEXT MD VISIT:  PRN   Clarita Crane, PT, DPT 06/08/23 7:18 AM     PHYSICAL  THERAPY DISCHARGE SUMMARY  Visits from Start of Care: 8  Current functional level related to goals / functional outcomes: See above   Remaining deficits: See above   Education / Equipment: HEP, DN   Patient agrees to discharge. Patient goals were met. Patient is being discharged due to meeting the stated rehab goals.  Clarita Crane, PT, DPT 09/29/23 1:25 PM  [facility] [address] [place], [place], [address] Phone:  [phone]   Fax:  [phone]

## 2023-06-17 ENCOUNTER — Encounter: Payer: Self-pay | Admitting: Sports Medicine

## 2023-06-17 ENCOUNTER — Encounter: Payer: Self-pay | Admitting: Physical Therapy

## 2023-07-05 ENCOUNTER — Other Ambulatory Visit: Payer: Self-pay

## 2023-07-05 MED ORDER — MONTELUKAST SODIUM 10 MG PO TABS: 10.0000 mg | ORAL_TABLET | Freq: Every day | ORAL | 3 refills | Status: DC

## 2023-07-05 MED ORDER — AZELASTINE HCL 0.1 % NA SOLN: NASAL | 3 refills | Status: DC

## 2023-07-05 NOTE — Telephone Encounter (Signed)
 Insurance cards received. Did the prescription get sent in to the requested pharmacy below?  Thanks

## 2024-04-23 ENCOUNTER — Encounter: Payer: Self-pay | Admitting: Radiology

## 2024-04-30 ENCOUNTER — Other Ambulatory Visit: Payer: Self-pay | Admitting: Allergy & Immunology

## 2024-05-25 ENCOUNTER — Encounter: Payer: Self-pay | Admitting: Allergy & Immunology

## 2024-05-29 ENCOUNTER — Encounter: Payer: Self-pay | Admitting: Allergy & Immunology

## 2024-05-29 ENCOUNTER — Ambulatory Visit: Payer: BC Managed Care – PPO | Admitting: Allergy & Immunology

## 2024-05-29 VITALS — BP 116/76 | HR 73 | Temp 98.9°F | Ht 63.0 in | Wt 141.0 lb

## 2024-05-29 DIAGNOSIS — L253 Unspecified contact dermatitis due to other chemical products: Secondary | ICD-10-CM | POA: Diagnosis not present

## 2024-05-29 DIAGNOSIS — J302 Other seasonal allergic rhinitis: Secondary | ICD-10-CM | POA: Diagnosis not present

## 2024-05-29 DIAGNOSIS — J3089 Other allergic rhinitis: Secondary | ICD-10-CM | POA: Diagnosis not present

## 2024-05-29 DIAGNOSIS — J4520 Mild intermittent asthma, uncomplicated: Secondary | ICD-10-CM

## 2024-05-29 MED ORDER — AZELASTINE HCL 0.1 % NA SOLN: NASAL | 4 refills | Status: AC

## 2024-05-29 MED ORDER — MONTELUKAST SODIUM 10 MG PO TABS: 10.0000 mg | ORAL_TABLET | Freq: Every day | ORAL | 4 refills | Status: AC

## 2024-05-29 NOTE — Patient Instructions (Addendum)
 1. Seasonal and perennial allergic rhinitis - Previous testing today showed: grasses, ragweed, weeds, trees, indoor molds, and outdoor molds - Continue taking: Singulair  (montelukast ) 10mg  daily  - Continue taking: Astelin  or Astepro  (ma 2 sprays per nostril twice daily). - We can keep the compounding pharmacy in the back pocket (there is one on [address]).  - Singulair  can cause irritability and bad dreams, so beware of this and stop if this happens. - You can use an extra dose of the antihistamine, if needed, for breakthrough symptoms.  - Consider nasal saline rinses 1-2 times daily to remove allergens from the nasal cavities as well as help with mucous clearance (this is especially helpful to do before the nasal sprays are given) - We can hold off on shots for now.   2. Intermittent asthma, uncomplicated - Lung testing looks amazing today (more than 100%). - Continue with albuterol as needed.   3. Return in about 1 year (around 05/29/2025). You can have the follow up appointment with Dr. Iva or a Nurse Practicioner (our Nurse Practitioners are excellent and always have Physician oversight!).    Please inform us  of any Emergency Department visits, hospitalizations, or changes in symptoms. Call us  before going to the ED for breathing or allergy  symptoms since we might be able to fit you in for a sick visit. Feel free to contact us  anytime with any questions, problems, or concerns.  It was a pleasure to see you again today!  Websites that have reliable patient information: 1. American Academy of Asthma, Allergy , and Immunology: www.aaaai.org 2. Food Allergy  Research and Education (FARE): foodallergy.org 3. Mothers of Asthmatics: http://www.asthmacommunitynetwork.org 4. [employer] of Allergy , Asthma, and Immunology: www.acaai.org      "Like" us  on Facebook and Instagram for our latest updates!      A healthy democracy works best when [employer] participate! Make  sure you are registered to vote! If you have moved or changed any of your contact information, you will need to get this updated before voting! Scan the QR codes below to learn more!

## 2024-05-29 NOTE — Progress Notes (Signed)
 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 Seasonal and perennial allergic rhinitis - Previous testing showed: grasses, ragweed, weeds, trees, indoor molds, and outdoor molds - Continue taking: Singulair  (montelukast ) 10mg  daily  - Continue taking: Astelin  or Astepro  (ma 2 sprays per nostril twice daily). - We can keep the compounding pharmacy in the back pocket (there is one on [address]).  - Singulair  can cause irritability and bad dreams, so beware of this and stop if this happens. - You can use an extra dose of the antihistamine, if needed, for breakthrough symptoms.  - Consider nasal saline rinses 1-2 times daily to remove allergens from the nasal cavities as well as help with mucous clearance (this is especially helpful to do before the nasal sprays are given) - We can hold off on shots for now.   2. Intermittent asthma, uncomplicated - Lung testing looks amazing today (more than 100%). - Continue with albuterol as needed.   3. Return in about 1 year (around 05/29/2025). You can have the follow up appointment with Dr. Iva or a Nurse Practicioner (our Nurse Practitioners are excellent and always have Physician oversight!).    Subjective:   [name] is a 53 y.o. female presenting today for follow up of  Chief Complaint  Patient presents with   Follow-up    1 year follow up  no concerns seasonal allergy     [name] has a history of the following: Patient Active Problem List   Diagnosis Date Noted   Childhood asthma 03/08/2023   Seasonal and perennial allergic rhinitis 03/08/2023    Contact dermatitis due to chemicals 03/08/2023   Fracture, Colles, left, closed    Umbilical hernia, reducible 01/05/2012   Rectus diastasis 01/05/2012    History obtained from: chart review and patient.  Discussed the use of AI scribe software for clinical note transcription with the patient and/or guardian, who gave verbal consent to proceed.  [name] is a 53 y.o. female presenting for a follow up visit.  She was last seen in December 2024.  At that time, we continue with Singulair  and started her on Astelin .  We felt that we can hold off on allergy  shots.  For her asthma, lung testing was amazing.  We continue with albuterol as needed.  At the last visit, she did receive her Pneumovax.  Since last visit, she has done very well.  She has not experienced any recent allergic reactions affecting her eyes, although they are currently dry due to environmental factors. She manages her symptoms with Singulair  and Astelazine, which she takes daily. She has not needed any allergy  eye drops recently. She knows that compounding is always an option.   She has glaucoma and uses preservative-free eye drops due to an allergy  to benzalkonium chloride (BAK), which is common in bottled eye drops.   No recent sinus infections or pneumonia. 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 She currently uses [employer] delivery for her medications as required by her insurance.     Work is going well. 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 This was signed during the visit today.   Otherwise, there have been no changes to her past medical history, surgical history, family history, or social history.    Review of systems otherwise negative other than that mentioned in the HPI.    Objective:   Blood pressure 116/76, pulse 73, temperature 98.9 F (37.2  C), height 5' 3 (1.6 m), weight 141 lb (64 kg), SpO2 97%. Body mass index is 24.98 kg/m.    Physical Exam Vitals reviewed.  Constitutional:      Appearance: She is well-developed.     Comments: Friendly. Cooperative with the exam.   HENT:     Head: Normocephalic and atraumatic.     Right Ear: Tympanic membrane, ear canal and external ear normal. No drainage, swelling or tenderness. Tympanic membrane is not injected, scarred, erythematous, retracted or bulging.     Left Ear: Tympanic membrane, ear canal and external ear normal. No drainage, swelling or tenderness. Tympanic membrane is not injected, scarred, erythematous, retracted or bulging.     Nose: No nasal deformity, septal deviation, mucosal edema or rhinorrhea.     Right Turbinates: Enlarged, swollen and pale.     Left Turbinates: Enlarged, swollen and pale.     Right Sinus: No maxillary sinus tenderness or frontal sinus tenderness.     Left Sinus: No maxillary sinus tenderness or frontal sinus tenderness.     Mouth/Throat:     Mouth: Mucous membranes are not pale and not dry.     Pharynx: Uvula midline.  Eyes:     General: Lids are normal. Allergic shiner present.        Right eye: No discharge.        Left eye: No discharge.     Conjunctiva/sclera: Conjunctivae normal.     Right eye: Right conjunctiva is not injected. No chemosis.    Left eye: Left conjunctiva is not injected. No chemosis.    Pupils: Pupils are equal, round, and reactive to light.  Cardiovascular:     Rate and Rhythm: Normal rate and regular rhythm.     Heart sounds: Normal heart sounds.  Pulmonary:     Effort: Pulmonary effort is normal. No tachypnea, accessory muscle usage or respiratory distress.     Breath sounds: Normal breath sounds. No wheezing, rhonchi or rales.     Comments: Moving air well in all lung fields. No increased work of breathing noted.  Chest:     Chest wall: No tenderness.  Lymphadenopathy:     Head:     Right side of head: No  submandibular, tonsillar or occipital adenopathy.     Left side of head: No submandibular, tonsillar or occipital adenopathy.     Cervical: No cervical adenopathy.  Skin:    Coloration: Skin is not pale.     Findings: No abrasion, erythema, petechiae or rash. Rash is not papular, urticarial or vesicular.  Neurological:     Mental Status: She is alert.  Psychiatric:        Behavior: Behavior is cooperative.      Diagnostic studies: none     Marty Shaggy, MD  [facility] 

## 2025-05-28 ENCOUNTER — Ambulatory Visit: Admitting: Allergy & Immunology
